# Patient Record
Sex: Female | Born: 1985 | Race: Black or African American | Hispanic: No | Marital: Single | Smoking: Never smoker
Health system: Southern US, Community
[De-identification: ages and names within clinical notes are randomized; demographics above are authoritative.]

## PROBLEM LIST (undated history)

## (undated) ENCOUNTER — Ambulatory Visit: Disposition: A | Discharge status: Other Acute Inpatient Hospital | Payer: Self-pay

## (undated) DIAGNOSIS — D649 Anemia, unspecified: Secondary | ICD-10-CM

## (undated) DIAGNOSIS — F909 Attention-deficit hyperactivity disorder, unspecified type: Secondary | ICD-10-CM

## (undated) DIAGNOSIS — D573 Sickle-cell trait: Secondary | ICD-10-CM

## (undated) DIAGNOSIS — F419 Anxiety disorder, unspecified: Secondary | ICD-10-CM

## (undated) DIAGNOSIS — J45909 Unspecified asthma, uncomplicated: Secondary | ICD-10-CM

## (undated) DIAGNOSIS — T7840XA Allergy, unspecified, initial encounter: Secondary | ICD-10-CM

## (undated) HISTORY — DX: Allergy, unspecified, initial encounter: T78.40XA

## (undated) HISTORY — PX: WISDOM TOOTH EXTRACTION: SHX21

## (undated) HISTORY — DX: Sickle-cell trait: D57.3

## (undated) HISTORY — DX: Anemia, unspecified: D64.9

---
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
## 2008-08-29 ENCOUNTER — Ambulatory Visit: Payer: Self-pay | Admitting: Internal Medicine

## 2008-08-29 DIAGNOSIS — R519 Headache, unspecified: Secondary | ICD-10-CM | POA: Insufficient documentation

## 2008-08-29 DIAGNOSIS — R51 Headache: Secondary | ICD-10-CM

## 2008-08-29 DIAGNOSIS — J45909 Unspecified asthma, uncomplicated: Secondary | ICD-10-CM

## 2008-08-29 LAB — CONVERTED CEMR LAB

## 2008-09-08 DIAGNOSIS — J309 Allergic rhinitis, unspecified: Secondary | ICD-10-CM | POA: Insufficient documentation

## 2008-09-08 DIAGNOSIS — D649 Anemia, unspecified: Secondary | ICD-10-CM

## 2008-09-12 ENCOUNTER — Ambulatory Visit: Payer: Self-pay | Admitting: Internal Medicine

## 2008-09-12 ENCOUNTER — Encounter: Payer: Self-pay | Admitting: Internal Medicine

## 2008-12-18 ENCOUNTER — Telehealth: Payer: Self-pay | Admitting: Internal Medicine

## 2008-12-18 ENCOUNTER — Emergency Department (HOSPITAL_COMMUNITY): Admission: EM | Admit: 2008-12-18 | Discharge: 2008-12-18 | Payer: Self-pay | Admitting: Emergency Medicine

## 2009-03-07 ENCOUNTER — Ambulatory Visit: Payer: Self-pay | Admitting: Internal Medicine

## 2009-03-07 DIAGNOSIS — M79609 Pain in unspecified limb: Secondary | ICD-10-CM

## 2009-10-15 ENCOUNTER — Ambulatory Visit: Payer: Self-pay | Admitting: Family

## 2010-06-30 NOTE — Assessment & Plan Note (Signed)
Summary: congestion & cough/dt   Vital Signs:  Patient profile:   25 year old female Height:      63.5 inches Weight:      132.25 pounds BMI:     23.14 O2 Sat:      99 % on Room air Temp:     98.6 degrees F oral Pulse rate:   86 / minute Pulse rhythm:   regular Resp:     12 per minute BP sitting:   90 / 62  (right arm) Cuff size:   regular  Vitals Entered By: Mervin Kung CMA (Oct 15, 2009 4:00 PM)  O2 Flow:  Room air CC: room 4  Pt states she has had a cough since Friday. Has run low grade fever. Pt needs refill on Qvar. Is Patient Diabetic? No   Primary Care Provider:  Dondra Spry DO  CC:  room 4  Pt states she has had a cough since Friday. Has run low grade fever. Pt needs refill on Qvar.Marland Kitchen  History of Present Illness: Ms Julie Lozano is a 25 year old female who presents today with complaint of cough x 5 days.  Cough is productive in AM.  Today she had green sputum.  Notes clear nasal congestion.  Saw Md at school clinic and was given rx for Zyrtec and Nasonex.  She stopped the Zyrtec due to dizziness.  Notes that she has had exacerbation of her ashtma symptoms along with her cough.  Notes + post nasal drip.  Did have slight fever of 99.1 yesterday.  Tell me that she never started Qvar and has run out of her albuterol inhaler.    Allergies (verified): No Known Drug Allergies  Physical Exam  General:  Well-developed,well-nourished,in no acute distress; alert,appropriate and cooperative throughout examination Eyes:  PERRLA Ears:  External ear exam shows no significant lesions or deformities.  Otoscopic examination reveals clear canals, tympanic membranes are intact bilaterally without bulging, retraction, inflammation or discharge. Hearing is grossly normal bilaterally. Neck:  mild cervical lymphadenopathy/tenderness noted Lungs:  Normal respiratory effort, chest expands symmetrically. Lungs are clear to auscultation, no crackles or wheezes. + hacking cough noted in exam  room Heart:  Normal rate and regular rhythm. S1 and S2 normal without gallop, murmur, click, rub or other extra sounds.   Impression & Recommendations:  Problem # 1:  ALLERGIC RHINITIS (ICD-477.9) Assessment Deteriorated Will try claritin to see if she tolerates this.  Recommended that she start nasonex as well. If no improvment with these measures, consider referral to an allergist. The following medications were removed from the medication list:    Cetirizine Hcl 10 Mg Tabs (Cetirizine hcl) .Marland Kitchen... Take 1 tablet by mouth once a day Her updated medication list for this problem includes:    Nasonex 50 Mcg/act Susp (Mometasone furoate) .Marland Kitchen... 2 sprays each nostril qd    Claritin 10 Mg Tabs (Loratadine) ..... One tablet by mouth daily  Problem # 2:  ASTHMA (ICD-493.90) Assessment: Deteriorated No wheezing today on exam, but suspect that cough is reactive in nature.  Recommended that she initiate Qvar and use albuterol regulary for the next few days.  Will add Zithromax to treat empirically for bronchitis given low grade fever and green sputum.  Patient advised to use condoms as back up protection due to abx- she tells me that she is not currently sexually active. The following medications were removed from the medication list:    Qvar 40 Mcg/act Aers (Beclomethasone dipropionate) .Marland Kitchen... 2 puffs bid Her  updated medication list for this problem includes:    Ventolin Hfa 108 (90 Base) Mcg/act Aers (Albuterol sulfate) .Marland Kitchen... 2 puffs by mouth as needed    Qvar 40 Mcg/act Aers (Beclomethasone dipropionate) .Marland Kitchen... 2 puffs twice daily  Complete Medication List: 1)  Multivitamins Tabs (Multiple vitamin) .... Take 1 tablet by mouth once a day 2)  Iromin-g Tabs (Iron-vitamins) .... Take 1 tablet by mouth once a day 3)  Ventolin Hfa 108 (90 Base) Mcg/act Aers (Albuterol sulfate) .... 2 puffs by mouth as needed 4)  Nasonex 50 Mcg/act Susp (Mometasone furoate) .... 2 sprays each nostril qd 5)  Ortho  Tri-cyclen (28) 0.18/0.215/0.25 Mg-35 Mcg Tabs (Norgestim-eth estrad triphasic) .... Once daily 6)  Qvar 40 Mcg/act Aers (Beclomethasone dipropionate) .... 2 puffs twice daily 7)  Zithromax Z-pak 250 Mg Tabs (Azithromycin) .... 2 tabs by mouth today, then one tablet by mouth daily x 4 more days 8)  Claritin 10 Mg Tabs (Loratadine) .... One tablet by mouth daily  Patient Instructions: 1)  Please call if symptoms worsen, or do not improve. Prescriptions: VENTOLIN HFA 108 (90 BASE) MCG/ACT AERS (ALBUTEROL SULFATE) 2 puffs by mouth as needed  #1 x 3   Entered and Authorized by:   Lemont Fillers FNP   Signed by:   Lemont Fillers FNP on 10/15/2009   Method used:   Electronically to        [facility] [address] (retail)       [address], [address]       [place], [place]  [address]       Ph: [phone]       Fax: [phone]   RxID:   [id number] ZITHROMAX Z-PAK 250 MG TABS (AZITHROMYCIN) 2 tabs by mouth today, then one tablet by mouth daily x 4 more days  #1 x 0   Entered and Authorized by:   Lemont Fillers FNP   Signed by:   Lemont Fillers FNP on 10/15/2009   Method used:   Electronically to        [facility] [address] (retail)       [address], [address]       [place], [place]  [address]       Ph: [phone]       Fax: [phone]   RxID:   [id number] QVAR 40 MCG/ACT AERS (BECLOMETHASONE DIPROPIONATE) 2 puffs twice daily  #1 x 1   Entered and Authorized by:   Lemont Fillers FNP   Signed by:   Lemont Fillers FNP on 10/15/2009   Method used:   Electronically to        [facility] [address] (retail)       [address], [address]       [place], [place]  [address]       Ph: [phone]       Fax: [phone]   RxID:   [id number]   Current Allergies (reviewed today): No known allergies

## 2011-07-23 ENCOUNTER — Other Ambulatory Visit: Payer: Self-pay

## 2011-07-28 ENCOUNTER — Encounter: Payer: Self-pay | Admitting: Internal Medicine

## 2012-08-25 ENCOUNTER — Encounter (HOSPITAL_COMMUNITY): Payer: Self-pay | Admitting: Emergency Medicine

## 2012-08-25 ENCOUNTER — Emergency Department (HOSPITAL_COMMUNITY)
Admission: EM | Admit: 2012-08-25 | Discharge: 2012-08-25 | Disposition: A | Discharge status: Home / Self Care | Payer: BC Managed Care – PPO | Attending: Emergency Medicine | Admitting: Emergency Medicine

## 2012-08-25 ENCOUNTER — Emergency Department (HOSPITAL_COMMUNITY): Payer: BC Managed Care – PPO

## 2012-08-25 DIAGNOSIS — R42 Dizziness and giddiness: Secondary | ICD-10-CM | POA: Insufficient documentation

## 2012-08-25 DIAGNOSIS — M62838 Other muscle spasm: Secondary | ICD-10-CM | POA: Insufficient documentation

## 2012-08-25 DIAGNOSIS — Z79899 Other long term (current) drug therapy: Secondary | ICD-10-CM | POA: Insufficient documentation

## 2012-08-25 DIAGNOSIS — J45909 Unspecified asthma, uncomplicated: Secondary | ICD-10-CM | POA: Insufficient documentation

## 2012-08-25 HISTORY — DX: Unspecified asthma, uncomplicated: J45.909

## 2012-08-25 MED ORDER — IBUPROFEN 800 MG PO TABS
800.0000 mg | ORAL_TABLET | Freq: Once | ORAL | Status: AC
  Administered 2012-08-25: 800 mg via ORAL
  Filled 2012-08-25: qty 1

## 2012-08-25 MED ORDER — METHOCARBAMOL 500 MG PO TABS
500.0000 mg | ORAL_TABLET | Freq: Once | ORAL | Status: AC
  Administered 2012-08-25: 500 mg via ORAL
  Filled 2012-08-25: qty 1

## 2012-08-25 MED ORDER — IBUPROFEN 400 MG PO TABS: 400.0000 mg | ORAL_TABLET | Freq: Four times a day (QID) | ORAL | Status: DC | PRN

## 2012-08-25 MED ORDER — METHOCARBAMOL 500 MG PO TABS: 500.0000 mg | ORAL_TABLET | Freq: Two times a day (BID) | ORAL | Status: DC

## 2012-08-25 MED ORDER — HYDROCODONE-ACETAMINOPHEN 5-325 MG PO TABS
2.0000 | ORAL_TABLET | Freq: Four times a day (QID) | ORAL | Status: DC | PRN
Start: 2012-08-25 — End: 2013-03-05

## 2012-08-25 NOTE — ED Notes (Signed)
Returned from xray

## 2012-08-25 NOTE — ED Notes (Signed)
States that she woke up with intense neck pain that radiates up into the lower aspect of her head down into her back. Track follows the trapezius muscle.

## 2012-08-25 NOTE — ED Notes (Signed)
[id number]<EX> Expected date:<BR> Expected time:<BR> Means of arrival:<BR> Comments:<BR> Muscle pain/ ems

## 2012-08-25 NOTE — ED Provider Notes (Signed)
History     CSN: [id number]  Arrival date & time 08/25/12  4098   First MD Initiated Contact with Patient 08/25/12 1001      Chief Complaint  Patient presents with  . Muscle Pain    (Consider location/radiation/quality/duration/timing/severity/associated sxs/prior treatment) HPI Comments: This is a 27 year old female who comes in today complaining of sudden onset sharp shooting neck pain. The pain starts on the left side of her occiput and travels down her back and shoulder when she moves. She states she has no pain when she cocks her head to the side, but any movement outside of that position causes excruciating 10/10 pain. It started this morning. She was laying in bed and was able to hit the snooze button on her alarm clock with no pain. Her alarm went off again and this time when she went to turn it off she felt the pain and heard a pop. Denies any numbness, tingling, weakness, abdominal pain, nausea, vomiting, or pain like this before.   The history is provided by the patient. No language interpreter was used.    Past Medical History  Diagnosis Date  . Asthma     History reviewed. No pertinent past surgical history.  No family history on file.  History  Substance Use Topics  . Smoking status: Not on file  . Smokeless tobacco: Not on file  . Alcohol Use: Not on file    OB History   Grav Para Term Preterm Abortions TAB SAB Ect Mult Living                  Review of Systems  Constitutional: Negative for fever, chills and unexpected weight change.  Eyes: Negative for photophobia and visual disturbance.  Respiratory: Negative for cough and shortness of breath.   Cardiovascular: Negative for chest pain.  Neurological: Positive for light-headedness (when she gets the sharp shooting pain). Negative for syncope, facial asymmetry, speech difficulty, weakness, numbness and headaches.  All other systems reviewed and are negative.    Allergies  Red dye  Home Medications    Current Outpatient Rx  Name  Route  Sig  Dispense  Refill  . albuterol (PROVENTIL HFA;VENTOLIN HFA) 108 (90 BASE) MCG/ACT inhaler   Inhalation   Inhale 2 puffs into the lungs every 4 (four) hours as needed for wheezing.         Marland Kitchen ibuprofen (ADVIL,MOTRIN) 200 MG tablet   Oral   Take 200 mg by mouth every 6 (six) hours as needed for pain.         . Multiple Vitamin (MULTIVITAMIN WITH MINERALS) TABS   Oral   Take 1 tablet by mouth daily.           BP 91/46  Pulse 60  Temp(Src) 98.7 F (37.1 C) (Oral)  Resp 21  SpO2 100%  LMP 08/23/2012  Physical Exam  Nursing note and vitals reviewed. Constitutional: She is oriented to person, place, and time. She appears well-developed and well-nourished. She does not appear ill. No distress.  HENT:  Head: Normocephalic and atraumatic.  Right Ear: External ear normal.  Left Ear: External ear normal.  Nose: Nose normal.  Mouth/Throat: Oropharynx is clear and moist.  Eyes: Conjunctivae, EOM and lids are normal. Pupils are equal, round, and reactive to light.  Neck: Trachea normal and phonation normal. Spinous process tenderness and muscular tenderness (tenderness over left paraspinal and trapezius) present. Decreased range of motion (head cocked to right - refuses to move due  to pain) present.  Cardiovascular: Normal rate, regular rhythm and normal heart sounds.   Pulmonary/Chest: Effort normal and breath sounds normal. No stridor. No respiratory distress. She has no wheezes. She has no rales.  Abdominal: Soft. Normal appearance and bowel sounds are normal. She exhibits no distension. There is no tenderness.  Musculoskeletal: She exhibits tenderness.  Lymphadenopathy:    She has no cervical adenopathy.  Neurological: She is alert and oriented to person, place, and time. She has normal strength. No sensory deficit. Coordination normal.  Skin: Skin is warm and dry. She is not diaphoretic. No erythema.  Psychiatric: She has a normal mood  and affect. Her behavior is normal.    ED Course  Procedures (including critical care time)  Labs Reviewed - No data to display Dg Cervical Spine Complete  08/25/2012  *RADIOLOGY REPORT*  Clinical Data: Severe lower cervical pain  CERVICAL SPINE - COMPLETE 4+ VIEW  Comparison: None.  Findings: Reversal of normal cervical lordosis.  The vertebral body heights and disc spaces are well preserved.   The neural foramina are patent bilaterally.  No fracture or subluxation identified. Suboptimal views of the odontoid process.  IMPRESSION:  1.  Reversal of the normal cervical lordosis which may reflect muscle spasm or patient positioning. 2.  No fracture or subluxation identified. 3.  Suboptimal imaging of the odontoid.   Original Report Authenticated By: Signa Kell, M.D.      1. Muscle spasms of neck       MDM  Patient presents today with sudden onset of neck pain with no injury. XR of cervical spine shows no fx or subluxation. No sensory deficit, neuro exam WNL. Suspect muscle spasm of neck due to awkward sleeping position. Given a muscle relaxer for spasm and Norco to take QHS. Showed patient stretching exercises. Resource guide given. Patient states she will establish care with PCP for follow up. Return precautions given including new onset numbness or weakness, fever, inability to eat or drink, worsening pain, abdominal pain, vomiting. Patient / Family / Caregiver informed of clinical course, understand medical decision-making process, and agree with plan.         Mora Bellman, PA-C 08/26/12 1241

## 2012-08-25 NOTE — ED Notes (Signed)
Patient transported to X-ray 

## 2012-08-25 NOTE — ED Notes (Signed)
Awaken this am and has neck/trapezia muscle pain since this am. States that she heard a pop this am when nursing her head. Denies any injury.

## 2012-08-26 NOTE — ED Provider Notes (Signed)
Medical screening examination/treatment/procedure(s) were performed by non-physician practitioner and as supervising physician I was immediately available for consultation/collaboration.  Flint Melter, MD 08/26/12 1623

## 2012-08-30 ENCOUNTER — Ambulatory Visit (INDEPENDENT_AMBULATORY_CARE_PROVIDER_SITE_OTHER): Payer: BC Managed Care – PPO | Admitting: Internal Medicine

## 2012-08-30 ENCOUNTER — Encounter: Payer: Self-pay | Admitting: Internal Medicine

## 2012-08-30 VITALS — BP 102/74 | HR 72 | Temp 98.3°F | Wt 135.0 lb

## 2012-08-30 DIAGNOSIS — L309 Dermatitis, unspecified: Secondary | ICD-10-CM | POA: Insufficient documentation

## 2012-08-30 DIAGNOSIS — M542 Cervicalgia: Secondary | ICD-10-CM

## 2012-08-30 DIAGNOSIS — L259 Unspecified contact dermatitis, unspecified cause: Secondary | ICD-10-CM

## 2012-08-30 MED ORDER — TRIAMCINOLONE ACETONIDE 0.1 % EX CREA: TOPICAL_CREAM | Freq: Two times a day (BID) | CUTANEOUS | Status: DC

## 2012-08-30 NOTE — Assessment & Plan Note (Signed)
27 year old [place] American female with left-sided neck pain. I agree she has neck spasm / somatic dysfunction.  Utilized myofacial release techniques and HVLA to lumbar and thoracic spine.  Patient tolerated well.  Continue use of muscle relaxants.  Reassess in 2 weeks.

## 2012-08-30 NOTE — Progress Notes (Signed)
Subjective:    Patient ID: Julie Lozano, female    DOB: [birth year], 27 y.o.   MRN: [id number]  HPI  27 year old [place] American female for emergency room followup. Patient seen on 08/25/2012 for  severe neck pain. Patient reports waking up in the morning and turning her head.  She hearing loud popping noise. Subsequently she developed severe left-sided neck pain and low back pain. Emergency room evaluation included x-ray of cervical spine. It was negative for any acute process. Patient noted to have reversal of normal cervical overdoses which may reflect muscle spasm or patient positioning.  She was prescribed Robaxin and hydrocodone. Patient reports some improvement in her neck symptoms. She still has limited range of motion and discomfort with certain positions. She denies any upper or lower extremity weakness.  Patient also complains of eczema on arms and legs. No improvement with otc hydrocortisone use.  Review of Systems Negative for weakness, no radiation of pain into arms or legs  Past Medical History  Diagnosis Date  . Asthma   . Anemia   . Allergy     History   Social History  . Marital Status: Married    Spouse Name: N/A    Number of Children: N/A  . Years of Education: N/A   Occupational History  . Not on file.   Social History Main Topics  . Smoking status: Never Smoker   . Smokeless tobacco: Not on file  . Alcohol Use: No  . Drug Use: No  . Sexually Active: Not on file   Other Topics Concern  . Not on file   Social History Narrative  . No narrative on file    History reviewed. No pertinent past surgical history.  Family History  Problem Relation Age of Onset  . Alcohol abuse Father   . Heart attack Father   . Hypertension    . Hyperlipidemia    . Diabetes Maternal Uncle     Allergies  Allergen Reactions  . Red Dye Diarrhea, Itching and Nausea Only    Current Outpatient Prescriptions on File Prior to Visit  Medication Sig Dispense Refill   . albuterol (PROVENTIL HFA;VENTOLIN HFA) 108 (90 BASE) MCG/ACT inhaler Inhale 2 puffs into the lungs every 4 (four) hours as needed for wheezing.      Marland Kitchen HYDROcodone-acetaminophen (NORCO/VICODIN) 5-325 MG per tablet Take 2 tablets by mouth every 6 (six) hours as needed for pain.  10 tablet  0  . ibuprofen (ADVIL,MOTRIN) 200 MG tablet Take 200 mg by mouth every 6 (six) hours as needed for pain.      Marland Kitchen ibuprofen (ADVIL,MOTRIN) 400 MG tablet Take 1 tablet (400 mg total) by mouth every 6 (six) hours as needed for pain.  30 tablet  0  . methocarbamol (ROBAXIN) 500 MG tablet Take 1 tablet (500 mg total) by mouth 2 (two) times daily.  20 tablet  0  . Multiple Vitamin (MULTIVITAMIN WITH MINERALS) TABS Take 1 tablet by mouth daily.       No current facility-administered medications on file prior to visit.    BP 102/74  Pulse 72  Temp(Src) 98.3 F (36.8 C) (Oral)  Wt 135 lb (61.236 kg)  BMI 23.54 kg/m2  LMP 08/23/2012       Objective:   Physical Exam  Constitutional: She is oriented to person, place, and time. She appears well-developed and well-nourished.  Cardiovascular: Normal rate, regular rhythm and normal heart sounds.   Pulmonary/Chest: Effort normal and breath sounds normal. She has no  wheezes.  Musculoskeletal:  Somatic dysfunction L4 rotated right, T7-9 rotated left  Neurological: She is alert and oriented to person, place, and time. She has normal reflexes. She displays normal reflexes. No cranial nerve deficit. She exhibits normal muscle tone.  Muscle strength is 5 out of 5 upper and lower extremities  Skin: Skin is warm and dry.  Scattered eczematous patches on forearms and legs  Psychiatric: She has a normal mood and affect. Her behavior is normal.          Assessment & Plan:

## 2012-08-30 NOTE — Patient Instructions (Addendum)
Perform stretching exercises as directed Please contact our office if your symptoms do not improve or gets worse.

## 2012-08-30 NOTE — Assessment & Plan Note (Signed)
Use triamcinolone cream as directed

## 2012-09-13 ENCOUNTER — Encounter: Payer: BC Managed Care – PPO | Admitting: Internal Medicine

## 2012-11-01 ENCOUNTER — Telehealth: Payer: Self-pay | Admitting: Internal Medicine

## 2012-11-01 NOTE — Telephone Encounter (Signed)
Please see my note.  I sent rx for triamcinolone crm 1% to her pharm.  If something went wrong with electronic rx, ok to resend medication or phone to pharm

## 2012-11-01 NOTE — Telephone Encounter (Signed)
Pt was seen on 08-30-12 and MD was suppose to call in med for eczema into [facility]

## 2012-11-02 MED ORDER — TRIAMCINOLONE ACETONIDE 0.1 % EX CREA: TOPICAL_CREAM | Freq: Two times a day (BID) | CUTANEOUS | Status: DC

## 2012-11-02 NOTE — Telephone Encounter (Signed)
rx sent in electronically, pt aware 

## 2013-03-05 ENCOUNTER — Ambulatory Visit (INDEPENDENT_AMBULATORY_CARE_PROVIDER_SITE_OTHER): Payer: BC Managed Care – PPO | Admitting: Family Medicine

## 2013-03-05 ENCOUNTER — Encounter: Payer: Self-pay | Admitting: Family Medicine

## 2013-03-05 VITALS — BP 110/62 | HR 95 | Temp 98.5°F | Wt 136.0 lb

## 2013-03-05 DIAGNOSIS — H6001 Abscess of right external ear: Secondary | ICD-10-CM

## 2013-03-05 DIAGNOSIS — H60399 Other infective otitis externa, unspecified ear: Secondary | ICD-10-CM

## 2013-03-05 MED ORDER — DOXYCYCLINE HYCLATE 100 MG PO CAPS: 100.0000 mg | ORAL_CAPSULE | Freq: Two times a day (BID) | ORAL | Status: DC

## 2013-03-05 NOTE — Progress Notes (Signed)
  Subjective:    Patient ID: Julie Lozano, female    DOB: 28-Aug-1985, 27 y.o.   MRN: [id number]  HPI Acute visit for right external ear infection Patient had right and left ears pierced involving the cartilage 3 weeks ago. One week ago she noticed some erythema, tenderness, redness, and swelling involving the right external ear. She noted drainage couple days ago. No fevers or chills. She has not had problems with piercings in the past. No history of MRSA.  Past Medical History  Diagnosis Date  . Asthma   . Anemia   . Allergy    No past surgical history on file.  reports that she has never smoked. She does not have any smokeless tobacco history on file. She reports that she does not drink alcohol or use illicit drugs. family history includes Alcohol abuse in her father; Diabetes in her maternal uncle; Heart attack in her father; Hyperlipidemia in an other family member; Hypertension in an other family member. Allergies  Allergen Reactions  . Red Dye Diarrhea, Itching and Nausea Only      Review of Systems  Constitutional: Negative for fever and chills.       Objective:   Physical Exam  Constitutional: She appears well-developed and well-nourished.  HENT:  Right external ear reveals mild erythema. She has some visible purulence under the skin with minimal pressure this is expressed. There no deeper pockets  Cardiovascular: Normal rate.   Pulmonary/Chest: Effort normal and breath sounds normal. No respiratory distress. She has no wheezes. She has no rales.          Assessment & Plan:  Abscess right external ear following piercings few weeks ago-draining spontaneously. Culture obtained. Doxycycline 100 mg 2 times a day for 10 days pending culture results. Warm compresses several times daily. Followup with primary in one week to reassess

## 2013-03-05 NOTE — Patient Instructions (Signed)
Warm compresses to right ear several times daily. Follow up promptly for any fever or increased swelling.

## 2013-03-08 LAB — WOUND CULTURE: Gram Stain: NONE SEEN

## 2013-03-13 ENCOUNTER — Ambulatory Visit: Payer: BC Managed Care – PPO | Admitting: Internal Medicine

## 2013-03-13 DIAGNOSIS — Z0289 Encounter for other administrative examinations: Secondary | ICD-10-CM

## 2013-04-19 ENCOUNTER — Telehealth: Payer: Self-pay | Admitting: Internal Medicine

## 2013-04-19 NOTE — Telephone Encounter (Signed)
Pt needs tb test asap. Would like to come in friday for that. Is it ok if she has it read on Monday?

## 2013-04-19 NOTE — Telephone Encounter (Signed)
Ok to schedule.

## 2013-04-19 NOTE — Telephone Encounter (Signed)
Pt following up on your phone call and the labs that were done in Oct. Pt states her symptoms are back full blown as before for her right ear. The piercing is infected . Would like a refill on med. Ok to leave message on [employer]

## 2013-04-19 NOTE — Telephone Encounter (Signed)
Informed patient that she would need to be seen again

## 2013-04-19 NOTE — Telephone Encounter (Signed)
lmom for pt that its ok to have tb test done on Friday. Lm to be her at 4:15pm

## 2013-04-20 ENCOUNTER — Ambulatory Visit: Payer: BC Managed Care – PPO | Admitting: *Deleted

## 2013-05-14 ENCOUNTER — Ambulatory Visit (INDEPENDENT_AMBULATORY_CARE_PROVIDER_SITE_OTHER): Payer: BC Managed Care – PPO | Admitting: Family Medicine

## 2013-05-14 ENCOUNTER — Encounter: Payer: Self-pay | Admitting: Family Medicine

## 2013-05-14 VITALS — BP 94/70 | Temp 97.6°F | Wt 138.0 lb

## 2013-05-14 DIAGNOSIS — H6690 Otitis media, unspecified, unspecified ear: Secondary | ICD-10-CM

## 2013-05-14 DIAGNOSIS — L309 Dermatitis, unspecified: Secondary | ICD-10-CM

## 2013-05-14 DIAGNOSIS — L089 Local infection of the skin and subcutaneous tissue, unspecified: Secondary | ICD-10-CM

## 2013-05-14 DIAGNOSIS — H6693 Otitis media, unspecified, bilateral: Secondary | ICD-10-CM

## 2013-05-14 DIAGNOSIS — L259 Unspecified contact dermatitis, unspecified cause: Secondary | ICD-10-CM

## 2013-05-14 MED ORDER — DOXYCYCLINE HYCLATE 100 MG PO TABS: 100.0000 mg | ORAL_TABLET | Freq: Two times a day (BID) | ORAL | Status: DC

## 2013-05-14 NOTE — Patient Instructions (Signed)
Take antibiotic as instructed  Remove earrings if continue to have issues  Cerave CREAM or Cetaphil Restoraderm for skin daily  Humidifier

## 2013-05-14 NOTE — Progress Notes (Signed)
Pre visit review using our clinic review tool, if applicable. No additional management support is needed unless otherwise documented below in the visit note. 

## 2013-05-14 NOTE — Progress Notes (Signed)
Chief Complaint  Patient presents with  . ear cartilage infection bilaterally    reddness, swelling, painful, colored pus since November    HPI:  Acute visit for ear pain: -started 3-4 weeks ago -swelling of ear where earrings are -had this before and treated with abx which resolved symptoms, cx at time with staph (not mrsa) -occ has pus from earring holes from time to time - a little better today -denies: swelling of rest of ear, face or fevers  Eczema: -chronic -out of steroid cream  ROS: See pertinent positives and negatives per HPI.  Past Medical History  Diagnosis Date  . Asthma   . Anemia   . Allergy     No past surgical history on file.  Family History  Problem Relation Age of Onset  . Alcohol abuse Father   . Heart attack Father   . Hypertension    . Hyperlipidemia    . Diabetes Maternal Uncle     History   Social History  . Marital Status: Married    Spouse Name: N/A    Number of Children: N/A  . Years of Education: N/A   Social History Main Topics  . Smoking status: Never Smoker   . Smokeless tobacco: None  . Alcohol Use: No  . Drug Use: No  . Sexual Activity: None   Other Topics Concern  . None   Social History Narrative  . None    Current outpatient prescriptions:albuterol (PROVENTIL HFA;VENTOLIN HFA) 108 (90 BASE) MCG/ACT inhaler, Inhale 2 puffs into the lungs every 4 (four) hours as needed for wheezing., Disp: , Rfl: ;  doxycycline (VIBRA-TABS) 100 MG tablet, Take 1 tablet (100 mg total) by mouth 2 (two) times daily., Disp: 20 tablet, Rfl: 0;  Multiple Vitamin (MULTIVITAMIN WITH MINERALS) TABS, Take 1 tablet by mouth daily., Disp: , Rfl:  triamcinolone cream (KENALOG) 0.1 %, Apply topically 2 (two) times daily., Disp: 30 g, Rfl: 2  EXAM:  Filed Vitals:   05/14/13 1541  BP: 94/70  Temp: 97.6 F (36.4 C)    Body mass index is 24.06 kg/(m^2).  GENERAL: vitals reviewed and listed above, alert, oriented, appears well hydrated and in  no acute distress  HEENT: atraumatic, conjunttiva clear, no obvious abnormalities on inspection of external nose and ears  NECK: no obvious masses on inspection  SKIN: mild erythema and crusting around earrings in helix, eczema hands and extremities  MS: moves all extremities without noticeable abnormality  PSYCH: pleasant and cooperative, no obvious depression or anxiety  ASSESSMENT AND PLAN:  Discussed the following assessment and plan:  Ear infection, bilateral - Plan: doxycycline (VIBRA-TABS) 100 MG tablet  Skin infection - Plan: doxycycline (VIBRA-TABS) 100 MG tablet  Eczema  -tx earring related infection with doxy and advised removal - may have meal sensitivity - has refills on steroid cream advised all hypoallergenic products, humidifier, emollients with ceramides -Patient advised to return or notify a doctor immediately if symptoms worsen or persist or new concerns arise.  Patient Instructions  Take antibiotic as instructed  Remove earrings if continue to have issues  Cerave CREAM or Cetaphil Restoraderm for skin daily  Humidifier        KIM, HANNAH R.

## 2013-07-23 ENCOUNTER — Encounter: Payer: BC Managed Care – PPO | Admitting: Family Medicine

## 2013-07-23 NOTE — Progress Notes (Signed)
Error   This encounter was created in error - please disregard. 

## 2013-08-22 ENCOUNTER — Ambulatory Visit: Payer: BC Managed Care – PPO | Admitting: Internal Medicine

## 2014-07-23 ENCOUNTER — Encounter: Payer: Self-pay | Admitting: Family Medicine

## 2014-07-23 ENCOUNTER — Ambulatory Visit (INDEPENDENT_AMBULATORY_CARE_PROVIDER_SITE_OTHER): Payer: No Typology Code available for payment source | Admitting: Family Medicine

## 2014-07-23 VITALS — HR 93 | Temp 97.9°F | Wt 137.0 lb

## 2014-07-23 DIAGNOSIS — R21 Rash and other nonspecific skin eruption: Secondary | ICD-10-CM

## 2014-07-23 MED ORDER — PREDNISONE 10 MG PO TABS: ORAL_TABLET | ORAL | Status: DC

## 2014-07-23 NOTE — Progress Notes (Signed)
Pre visit review using our clinic review tool, if applicable. No additional management support is needed unless otherwise documented below in the visit note. 

## 2014-07-23 NOTE — Progress Notes (Signed)
   Subjective:    Patient ID: Julie BerryJessica Lozano, female    DOB: 1986-04-30, 29 y.o.   MRN: [id number]  HPI Acute visit for rash forearms. She has long history of allergies and atopic eczema. She has seen dermatologist in past (for her atopic eczema). This current rash is different from her typical eczema rash. She has 3 day history of follicular type rash on both forearms. Very pruritic. Symptoms activities especially worse at night. She's not tried any antihistamines. She had some triamcinolone 0.1% cream which does not help. No fevers or chills. No recent change of soaps or detergents. She does not have any other areas of involvement. She works in [employer]. 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   Review of Systems  Constitutional: Negative for fever and chills.  Skin: Positive for rash.       Objective:   Physical Exam  Constitutional: She appears well-developed and well-nourished.  Cardiovascular: Normal rate and regular rhythm.   Pulmonary/Chest: Effort normal and breath sounds normal. No respiratory distress. She has no wheezes. She has no rales.  Skin: Rash noted.  Very small pinpoint follicular rash forearms. No pustules. No urticaria. No scaling.          Assessment & Plan:  Nonspecific follicular type rash forearms. Uncertain etiology. Try over-the-counter Claritin, Zyrtec, or Allegra for itching. Consider prednisone taper if not resolving with the above. Long sleeves when using cleaning solutions that are aerosolized to avoid skin contact.

## 2014-07-23 NOTE — Patient Instructions (Signed)
Try over the counter Allegra, Claritan, or Zyrtec Consider prednisone taper if not resolving with the above.

## 2014-09-14 ENCOUNTER — Emergency Department (HOSPITAL_COMMUNITY)
Admission: EM | Admit: 2014-09-14 | Discharge: 2014-09-14 | Disposition: A | Discharge status: Home / Self Care | Payer: No Typology Code available for payment source | Attending: Emergency Medicine | Admitting: Emergency Medicine

## 2014-09-14 ENCOUNTER — Encounter (HOSPITAL_COMMUNITY): Payer: Self-pay | Admitting: Emergency Medicine

## 2014-09-14 DIAGNOSIS — Z23 Encounter for immunization: Secondary | ICD-10-CM | POA: Diagnosis not present

## 2014-09-14 DIAGNOSIS — Y9389 Activity, other specified: Secondary | ICD-10-CM | POA: Diagnosis not present

## 2014-09-14 DIAGNOSIS — IMO0002 Reserved for concepts with insufficient information to code with codable children: Secondary | ICD-10-CM

## 2014-09-14 DIAGNOSIS — Y998 Other external cause status: Secondary | ICD-10-CM | POA: Diagnosis not present

## 2014-09-14 DIAGNOSIS — Z79899 Other long term (current) drug therapy: Secondary | ICD-10-CM | POA: Insufficient documentation

## 2014-09-14 DIAGNOSIS — Z862 Personal history of diseases of the blood and blood-forming organs and certain disorders involving the immune mechanism: Secondary | ICD-10-CM | POA: Diagnosis not present

## 2014-09-14 DIAGNOSIS — J45909 Unspecified asthma, uncomplicated: Secondary | ICD-10-CM | POA: Diagnosis not present

## 2014-09-14 DIAGNOSIS — Y9289 Other specified places as the place of occurrence of the external cause: Secondary | ICD-10-CM | POA: Diagnosis not present

## 2014-09-14 DIAGNOSIS — S61212A Laceration without foreign body of right middle finger without damage to nail, initial encounter: Secondary | ICD-10-CM | POA: Insufficient documentation

## 2014-09-14 DIAGNOSIS — Y288XXA Contact with other sharp object, undetermined intent, initial encounter: Secondary | ICD-10-CM | POA: Insufficient documentation

## 2014-09-14 MED ORDER — TETANUS-DIPHTH-ACELL PERTUSSIS 5-2.5-18.5 LF-MCG/0.5 IM SUSP
0.5000 mL | Freq: Once | INTRAMUSCULAR | Status: AC
  Administered 2014-09-14: 0.5 mL via INTRAMUSCULAR
  Filled 2014-09-14: qty 0.5

## 2014-09-14 MED ORDER — LIDOCAINE HCL (PF) 1 % IJ SOLN
5.0000 mL | Freq: Once | INTRAMUSCULAR | Status: AC
  Administered 2014-09-14: 5 mL via INTRADERMAL
  Filled 2014-09-14: qty 5

## 2014-09-14 NOTE — ED Notes (Signed)
Pt. Stated, I cut my right index finger while opening a can

## 2014-09-14 NOTE — ED Provider Notes (Signed)
CSN: [id number]     Arrival date & time 09/14/14  1131 History  This chart was scribed for Harle Battiest, NP, working with Samuel Jester, DO by Chestine Spore, ED Scribe. The patient was seen in room TR05C/TR05C at 12:17 PM.    Chief Complaint  Patient presents with  . Extremity Laceration      The history is provided by the patient. No language interpreter was used.    HPI Comments: Julie Lozano is a 29 y.o. female who presents to the Emergency Department complaining of extremity laceration onset today 1 hour ago PTA. Pt cut her finger while opening a can today. Pt notes that her pain is 3-4/10 but it is worsened with movement. Pt is not UTD on her tetanus. She denies joint swelling and any other symptoms.    Past Medical History  Diagnosis Date  . Asthma   . Anemia   . Allergy    History reviewed. No pertinent past surgical history. Family History  Problem Relation Age of Onset  . Alcohol abuse Father   . Heart attack Father   . Hypertension    . Hyperlipidemia    . Diabetes Maternal Uncle    History  Substance Use Topics  . Smoking status: Never Smoker   . Smokeless tobacco: Not on file  . Alcohol Use: No   OB History    No data available     Review of Systems  Skin: Positive for wound. Negative for color change.  Neurological: Negative for weakness.      Allergies  Red dye and Shellfish allergy  Home Medications   Prior to Admission medications   Medication Sig Start Date End Date Taking? Authorizing Provider  albuterol (PROVENTIL HFA;VENTOLIN HFA) 108 (90 BASE) MCG/ACT inhaler Inhale 2 puffs into the lungs every 4 (four) hours as needed for wheezing.   Yes Historical Provider, MD  Multiple Vitamin (MULTIVITAMIN WITH MINERALS) TABS Take 1 tablet by mouth daily.   Yes Historical Provider, MD  predniSONE (DELTASONE) 10 MG tablet Taper as follows: 4-4-4-3-3-2-2-1-1 Patient not taking: Reported on 09/14/2014 07/23/14   Kristian Covey, MD   BP  113/58 mmHg  Pulse 65  Temp(Src) 98 F (36.7 C) (Oral)  Resp 14  Ht  (1.6 m)  Wt 135 lb (61.236 kg)  BMI 23.92 kg/m2  SpO2 99%  LMP 09/07/2014  Physical Exam  Constitutional: She is oriented to person, place, and time. She appears well-developed and well-nourished. No distress.  HENT:  Head: Normocephalic and atraumatic.  Eyes: EOM are normal.  Neck: Neck supple. No tracheal deviation present.  Cardiovascular: Normal rate.   Pulmonary/Chest: Effort normal. No respiratory distress.  Musculoskeletal: Normal range of motion.  5/5 strength with flexion and extension of the affected finger. NVI.  Neurological: She is alert and oriented to person, place, and time.  Skin: Skin is warm and dry. Laceration noted.  1.5 cm lunar shaped laceration with the edges well approximate and bleeding well controlled. laceration to the lateral aspect of right middle finger.  Psychiatric: She has a normal mood and affect. Her behavior is normal.  Nursing note and vitals reviewed.   ED Course  Procedures (including critical care time) LACERATION REPAIR Performed by: Harle Battiest Authorized by: Harle Battiest Consent: Verbal consent obtained. Risks and benefits: risks, benefits and alternatives were discussed Consent given by: patient Patient identity confirmed: provided demographic data Prepped and Draped in normal sterile fashion Wound explored  Laceration Location: right middle finger  Laceration  Length: 1.5 cm  No Foreign Bodies seen or palpated  Anesthesia: local infiltration  Local anesthetic: lidocaine 1% without epinephrine  Anesthetic total: 3 ml  Irrigation method: syringe Amount of cleaning: standard  Skin closure: 5-0 prolene  Number of sutures: 2  Technique: simple interrupted  Patient tolerance: Patient tolerated the procedure well with no immediate complications.   DIAGNOSTIC STUDIES: Oxygen Saturation is 99% on RA, nl by my interpretation.     COORDINATION OF CARE: 12:20 PM-Discussed treatment plan which includes laceration repair and f/u in 7 days for suture removal with pt at bedside and pt agreed to plan.   Labs Review Labs Reviewed - No data to display  Imaging Review No results found.   EKG Interpretation None      MDM   Final diagnoses:  Laceration   29 yo nwith laceration occuring < 2 hours prior to repair. Tdap booster given. ressure irrigation performed. Pt has no co morbidities to effect normal wound healing. Discussed suture home care w pt and answered questions. Pt is well-appearing, in no acute distress and vital signs reviewed and not concerning. She appears safe to be discharged.  Discharge include follow-up  for wound check and suture removal in 7 days.   Return precautions provided. Pt aware of plan and in agreement.    I personally performed the services described in this documentation, which was scribed in my presence. The recorded information has been reviewed and is accurate.  Filed Vitals:   09/14/14 1146 09/14/14 1342  BP: 113/58 103/64  Pulse: 65 66  Temp: 98 F (36.7 C) 97.4 F (36.3 C)  TempSrc: Oral Oral  Resp: 14 16  Height: 5\' 3"  (1.6 m)   Weight: 135 lb (61.236 kg)   SpO2: 99% 100%   Meds given in ED:  Medications  lidocaine (PF) (XYLOCAINE) 1 % injection 5 mL (5 mLs Intradermal Given 09/14/14 1231)  Tdap (BOOSTRIX) injection 0.5 mL (0.5 mLs Intramuscular Given 09/14/14 1350)    Discharge Medication List as of 09/14/2014  1:43 PM        Harle BattiestElizabeth Alger Kerstein, NP 09/15/14 [id number]  Samuel JesterKathleen McManus, DO 09/15/14 1400

## 2014-09-14 NOTE — Discharge Instructions (Signed)
Please follow directions provided. Be sure to follow-up in 7 days in the emergency department or with your primary care doctor to have your stitches removed. Keep your wound clean and dry, change her dressing daily using antibiotic ointment. You may use Tylenol or ibuprofen for pain. Don't hesitate to return for any new, worsening, or concerning symptoms.   SEEK IMMEDIATE MEDICAL CARE IF:  Your pain is not controlled with prescribed medicine.  You have severe swelling around the wound causing pain and numbness or a change in color in your arm, hand, leg, or foot.  Your wound splits open and starts bleeding.  You have worsening numbness, weakness, or loss of function of any joint around or beyond the wound.  You develop painful lumps near the wound or on the skin anywhere on your body.

## 2014-09-17 ENCOUNTER — Emergency Department (HOSPITAL_COMMUNITY)
Admission: EM | Admit: 2014-09-17 | Discharge: 2014-09-17 | Disposition: A | Discharge status: Home / Self Care | Payer: No Typology Code available for payment source | Attending: Emergency Medicine | Admitting: Emergency Medicine

## 2014-09-17 ENCOUNTER — Encounter (HOSPITAL_COMMUNITY): Payer: Self-pay

## 2014-09-17 DIAGNOSIS — Z4801 Encounter for change or removal of surgical wound dressing: Secondary | ICD-10-CM | POA: Diagnosis present

## 2014-09-17 DIAGNOSIS — Z862 Personal history of diseases of the blood and blood-forming organs and certain disorders involving the immune mechanism: Secondary | ICD-10-CM | POA: Insufficient documentation

## 2014-09-17 DIAGNOSIS — Z5189 Encounter for other specified aftercare: Secondary | ICD-10-CM

## 2014-09-17 DIAGNOSIS — Z79899 Other long term (current) drug therapy: Secondary | ICD-10-CM | POA: Diagnosis not present

## 2014-09-17 DIAGNOSIS — J45909 Unspecified asthma, uncomplicated: Secondary | ICD-10-CM | POA: Diagnosis not present

## 2014-09-17 NOTE — Discharge Instructions (Signed)
Fingertip Injuries and Amputations °Fingertip injuries are common and often get injured because they are last to escape when pulling your hand out of harm's way. You have amputated (cut off) part of your finger. How this turns out depends largely on how much was amputated. If just the tip is amputated, often the end of the finger will grow back and the finger may return to much the same as it was before the injury.  °If more of the finger is missing, your caregiver has done the best with the tissue remaining to allow you to keep as much finger as is possible. Your caregiver after checking your injury has tried to leave you with a painless fingertip that has durable, feeling skin. If possible, your caregiver has tried to maintain the finger's length and appearance and preserve its fingernail.  °Please read the instructions outlined below and refer to this sheet in the next few weeks. These instructions provide you with general information on caring for yourself. Your caregiver may also give you specific instructions. While your treatment has been done according to the most current medical practices available, unavoidable complications occasionally occur. If you have any problems or questions after discharge, please call your caregiver. °HOME CARE INSTRUCTIONS  °· You may resume normal diet and activities as directed or allowed. °· Keep your hand elevated above the level of your heart. This helps decrease pain and swelling. °· Keep ice packs (or a bag of ice wrapped in a towel) on the injured area for 15-20 minutes, 03-04 times per day, for the first two days. °· Change dressings if necessary or as directed. °· Clean the wound daily or as directed. °· Only take over-the-counter or prescription medicines for pain, discomfort, or fever as directed by your caregiver. °· Keep appointments as directed. °SEEK IMMEDIATE MEDICAL CARE IF: °· You develop redness, swelling, numbness or increasing pain in the wound. °· There is  pus coming from the wound. °· You develop an unexplained oral temperature above 102° F (38.9° C) or as your caregiver suggests. °· There is a foul (bad) smell coming from the wound or dressing. °· There is a breaking open of the wound (edges not staying together) after sutures or staples have been removed. °MAKE SURE YOU:  °· Understand these instructions. °· Will watch your condition. °· Will get help right away if you are not doing well or get worse. °Document Released: 04/07/2005 Document Revised: 08/09/2011 Document Reviewed: 03/06/2008 °[employer]® Patient Information ©2015 [employer]. This information is not intended to replace advice given to you by your health care provider. Make sure you discuss any questions you have with your health care provider. ° °

## 2014-09-17 NOTE — ED Provider Notes (Signed)
CSN: [id number]     Arrival date & time 09/17/14  1910 History  This chart was scribed for non-physician practitioner Roxy Horseman, PA-C working with Arby Barrette, MD by Littie Deeds, ED Scribe. This patient was seen in room TR05C/TR05C and the patient's care was started at 7:33 PM.       Chief Complaint  Patient presents with  . Suture / Staple Removal   The history is provided by the patient. No language interpreter was used.   HPI Comments: Julie Lozano is a 29 y.o. female who presents to the Emergency Department for wound check. Patient states that she had stitches placed in her finger a few days ago. She states that it feels like one of the stitches burst. She denies any additional significant bleeding, discharge, or drainage. Denies any fevers, chills. States that it is still painful to palpation. Denies difficulty with range of motion.   Past Medical History  Diagnosis Date  . Asthma   . Anemia   . Allergy    History reviewed. No pertinent past surgical history. Family History  Problem Relation Age of Onset  . Alcohol abuse Father   . Heart attack Father   . Hypertension    . Hyperlipidemia    . Diabetes Maternal Uncle    History  Substance Use Topics  . Smoking status: Never Smoker   . Smokeless tobacco: Not on file  . Alcohol Use: No   OB History    No data available     Review of Systems  Constitutional: Negative for fever and chills.  Respiratory: Negative for shortness of breath.   Cardiovascular: Negative for chest pain.  Gastrointestinal: Negative for nausea, vomiting, diarrhea and constipation.  Genitourinary: Negative for dysuria.  Skin: Positive for wound.      Allergies  Red dye and Shellfish allergy  Home Medications   Prior to Admission medications   Medication Sig Start Date End Date Taking? Authorizing Provider  albuterol (PROVENTIL HFA;VENTOLIN HFA) 108 (90 BASE) MCG/ACT inhaler Inhale 2 puffs into the lungs every 4 (four) hours as  needed for wheezing.    Historical Provider, MD  Multiple Vitamin (MULTIVITAMIN WITH MINERALS) TABS Take 1 tablet by mouth daily.    Historical Provider, MD  predniSONE (DELTASONE) 10 MG tablet Taper as follows: 4-4-4-3-3-2-2-1-1 Patient not taking: Reported on 09/14/2014 07/23/14   Kristian Covey, MD   BP 113/60 mmHg  Pulse 73  Temp(Src) 98 F (36.7 C) (Oral)  Resp 18  Ht  (1.6 m)  Wt 138 lb 14.4 oz (63.005 kg)  BMI 24.61 kg/m2  SpO2 98%  LMP 09/07/2014 Physical Exam  Constitutional: She is oriented to person, place, and time. She appears well-developed and well-nourished. No distress.  HENT:  Head: Normocephalic and atraumatic.  Mouth/Throat: Oropharynx is clear and moist. No oropharyngeal exudate.  Eyes: Pupils are equal, round, and reactive to light.  Neck: Neck supple.  Cardiovascular: Normal rate and intact distal pulses.   Pulmonary/Chest: Effort normal.  Musculoskeletal: She exhibits no edema.  Neurological: She is alert and oriented to person, place, and time. No cranial nerve deficit.  Skin: Skin is warm and dry. No rash noted.  Well-healing laceration to right distal middle finger, no discharge, no drainage, no evidence of infection  Psychiatric: She has a normal mood and affect. Her behavior is normal.  Nursing note and vitals reviewed.   ED Course  Procedures  DIAGNOSTIC STUDIES: Oxygen Saturation is 98% on room air, normal by my interpretation.  COORDINATION OF CARE:   Labs Review Labs Reviewed - No data to display  Imaging Review No results found.   EKG Interpretation None      MDM   Final diagnoses:  Visit for wound check    Sutures remain intact, and there is no evidence of infection. Will give the patient a finger splint for protection. Discharge to home with return precautions. Sutures out in 8 days.  I personally performed the services described in this documentation, which was scribed in my presence. The recorded information has  been reviewed and is accurate.     Roxy HorsemanRobert Sierra Bissonette, PA-C 09/17/14 2008  Arby BarretteMarcy Pfeiffer, MD 09/27/14 [phone]

## 2014-09-17 NOTE — ED Notes (Signed)
Pt had stretch bandage stuck to sutures.  Pt wants splint for finger to protect finger at work.

## 2014-09-17 NOTE — ED Notes (Signed)
Pt had sutures placed Saturday, sts she thinks they came loose and having pain, requesting splint due to job role also

## 2014-10-04 ENCOUNTER — Encounter: Payer: Self-pay | Admitting: Family Medicine

## 2014-10-04 ENCOUNTER — Encounter: Payer: Self-pay | Admitting: *Deleted

## 2014-10-04 ENCOUNTER — Ambulatory Visit (INDEPENDENT_AMBULATORY_CARE_PROVIDER_SITE_OTHER): Payer: No Typology Code available for payment source | Admitting: Family Medicine

## 2014-10-04 VITALS — BP 100/70 | HR 86 | Temp 97.6°F | Ht 63.0 in | Wt 135.8 lb

## 2014-10-04 DIAGNOSIS — M791 Myalgia, unspecified site: Secondary | ICD-10-CM

## 2014-10-04 DIAGNOSIS — T148 Other injury of unspecified body region: Secondary | ICD-10-CM

## 2014-10-04 DIAGNOSIS — IMO0002 Reserved for concepts with insufficient information to code with codable children: Secondary | ICD-10-CM

## 2014-10-04 MED ORDER — TIZANIDINE HCL 2 MG PO TABS: 2.0000 mg | ORAL_TABLET | Freq: Every evening | ORAL | Status: DC | PRN

## 2014-10-04 NOTE — Patient Instructions (Signed)
BEFORE YOU LEAVE: -upper back exercises -schedule follow up in 3-4 weeks - or sooner if worsening or new symptoms  Do the exercises daily  Aleve 1-2 tablets not more then twice daily or tylenol 500-1000mg  up to 3 times daily AS NEEDED for pain  Zanaflex (tizanidine) 2mg  once nightly to help relax muscles
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
## 2014-10-04 NOTE — Progress Notes (Signed)
Pre visit review using our clinic review tool, if applicable. No additional management support is needed unless otherwise documented below in the visit note. 

## 2014-11-01 ENCOUNTER — Ambulatory Visit (INDEPENDENT_AMBULATORY_CARE_PROVIDER_SITE_OTHER): Payer: Self-pay | Admitting: Family Medicine

## 2014-11-01 DIAGNOSIS — R69 Illness, unspecified: Secondary | ICD-10-CM

## 2014-11-01 NOTE — Progress Notes (Signed)
NO SHOW

## 2014-12-21 ENCOUNTER — Ambulatory Visit (INDEPENDENT_AMBULATORY_CARE_PROVIDER_SITE_OTHER): Payer: No Typology Code available for payment source | Admitting: Family Medicine

## 2014-12-21 ENCOUNTER — Encounter: Payer: Self-pay | Admitting: Family Medicine

## 2014-12-21 VITALS — BP 118/76 | Temp 98.4°F | Wt 129.0 lb

## 2014-12-21 DIAGNOSIS — M79672 Pain in left foot: Secondary | ICD-10-CM | POA: Insufficient documentation

## 2014-12-21 NOTE — Assessment & Plan Note (Signed)
Post op shoe given to patient See PcP if symptoms do not improve.

## 2014-12-21 NOTE — Progress Notes (Signed)
   Subjective:    Patient ID: Julie Lozano, female    DOB: [birth year], 29 y.o.   MRN: [id number]  HPI Pt here c/o R foot pain.  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  Her post op shoe is old and finally broke.     Review of Systems  Constitutional: Negative for diaphoresis, appetite change, fatigue and unexpected weight change.  Eyes: Negative for pain, redness and visual disturbance.  Respiratory: Negative for cough, chest tightness, shortness of breath and wheezing.   Cardiovascular: Negative for chest pain, palpitations and leg swelling.  Endocrine: Negative for cold intolerance, heat intolerance, polydipsia, polyphagia and polyuria.  Genitourinary: Negative for dysuria, frequency and difficulty urinating.  Musculoskeletal: Positive for joint swelling and gait problem.       Pt c/o pain in ball L foot--- was diagnosed with "dancers foot" many years ago  Neurological: Negative for dizziness, light-headedness, numbness and headaches.   Past Medical History  Diagnosis Date  . Asthma   . Anemia   . Allergy    Current Outpatient Prescriptions on File Prior to Visit  Medication Sig Dispense Refill  . Multiple Vitamin (MULTIVITAMIN WITH MINERALS) TABS Take 1 tablet by mouth daily.    Marland Kitchen tiZANidine (ZANAFLEX) 2 MG tablet Take 1 tablet (2 mg total) by mouth at bedtime as needed for muscle spasms. 20 tablet 0  . albuterol (PROVENTIL HFA;VENTOLIN HFA) 108 (90 BASE) MCG/ACT inhaler Inhale 2 puffs into the lungs every 4 (four) hours as needed for wheezing.     No current facility-administered medications on file prior to visit.   Allergies  Allergen Reactions  . Red Dye Diarrhea, Itching and Nausea Only  . Shellfish Allergy    History   Social History  . Marital Status: Married    Spouse Name: N/A  . Number of Children: N/A  . Years of Education: N/A   Occupational History  . Not on file.   Social  History Main Topics  . Smoking status: Never Smoker   . Smokeless tobacco: Not on file  . Alcohol Use: No  . Drug Use: No  . Sexual Activity: Not on file   Other Topics Concern  . Not on file   Social History Narrative       Objective:   Physical Exam  Musculoskeletal:       Left foot: There is tenderness, bony tenderness and swelling. There is normal range of motion, normal capillary refill, no crepitus, no deformity and no laceration.       Feet:          Assessment & Plan:

## 2014-12-28 ENCOUNTER — Ambulatory Visit (INDEPENDENT_AMBULATORY_CARE_PROVIDER_SITE_OTHER): Payer: No Typology Code available for payment source | Admitting: Family Medicine

## 2014-12-28 ENCOUNTER — Encounter: Payer: Self-pay | Admitting: Family Medicine

## 2014-12-28 VITALS — BP 98/70 | HR 76 | Temp 97.9°F | Resp 18 | Ht 63.0 in | Wt 130.0 lb

## 2014-12-28 DIAGNOSIS — H109 Unspecified conjunctivitis: Secondary | ICD-10-CM | POA: Diagnosis not present

## 2014-12-28 MED ORDER — ERYTHROMYCIN 5 MG/GM OP OINT: 1.0000 "application " | TOPICAL_OINTMENT | Freq: Every day | OPHTHALMIC | Status: DC

## 2014-12-28 NOTE — Progress Notes (Signed)
  HPI:  ? 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  Past Medical History  Diagnosis Date  . Asthma   . Anemia   . Allergy     No past surgical history on file.  Family History  Problem Relation Age of Onset  . Alcohol abuse Father   . Heart attack Father   . Hypertension    . Hyperlipidemia    . Diabetes Maternal Uncle     History   Social History  . Marital Status: Married    Spouse Name: N/A  . Number of Children: N/A  . Years of Education: N/A   Social History Main Topics  . Smoking status: Never Smoker   . Smokeless tobacco: Not on file  . Alcohol Use: No  . Drug Use: No  . Sexual Activity: Not on file   Other Topics Concern  . None   Social History Narrative     Current outpatient prescriptions:  .  albuterol (PROVENTIL HFA;VENTOLIN HFA) 108 (90 BASE) MCG/ACT inhaler, Inhale 2 puffs into the lungs every 4 (four) hours as needed for wheezing., Disp: , Rfl:  .  Multiple Vitamin (MULTIVITAMIN WITH MINERALS) TABS, Take 1 tablet by mouth daily., Disp: , Rfl:  .  tiZANidine (ZANAFLEX) 2 MG tablet, Take 1 tablet (2 mg total) by mouth at bedtime as needed for muscle spasms., Disp: 20 tablet, Rfl: 0 .  erythromycin ophthalmic ointment, Place 1 application into the left eye at bedtime., Disp: 3.5 g, Rfl: 0  EXAM:  Filed Vitals:   12/28/14 0920  BP: 98/70  Pulse: 76  Temp: 97.9 F (36.6 C)  Resp: 18    Body mass index is 23.03 kg/(m^2).  GENERAL: vitals reviewed and listed above, alert, oriented, appears well hydrated and in no acute distress  HEENT: atraumatic, conjunttiva erythematous L with minimal amount of discharge, PERRLA, vision grossly intact, no obvious abnormalities on inspection of external nose and  ears  NECK: no obvious masses on inspection  PSYCH: pleasant and cooperative, no obvious depression or anxiety  ASSESSMENT AND PLAN:  Discussed the following assessment and plan:  Conjunctivitis of left eye  -compresses -abx eye oint -letter for work -return and optho precautions -Patient advised to return or notify a doctor immediately if symptoms worsen or persist or new concerns arise.  There are no Patient Instructions on file for this visit.   Kriste Basque R.

## 2014-12-28 NOTE — Patient Instructions (Signed)
Compresses  Eye ointment before bed

## 2014-12-28 NOTE — Progress Notes (Signed)
Pre visit review using our clinic review tool, if applicable. No additional management support is needed unless otherwise documented below in the visit note. 

## 2015-01-30 ENCOUNTER — Ambulatory Visit (INDEPENDENT_AMBULATORY_CARE_PROVIDER_SITE_OTHER): Payer: No Typology Code available for payment source | Admitting: Internal Medicine

## 2015-01-30 ENCOUNTER — Encounter: Payer: Self-pay | Admitting: Internal Medicine

## 2015-01-30 VITALS — BP 104/70 | HR 89 | Temp 99.9°F | Ht 63.0 in | Wt 132.0 lb

## 2015-01-30 DIAGNOSIS — J069 Acute upper respiratory infection, unspecified: Secondary | ICD-10-CM

## 2015-01-30 DIAGNOSIS — J3089 Other allergic rhinitis: Secondary | ICD-10-CM | POA: Diagnosis not present

## 2015-01-30 DIAGNOSIS — J4520 Mild intermittent asthma, uncomplicated: Secondary | ICD-10-CM

## 2015-01-30 NOTE — Progress Notes (Signed)
Pre visit review using our clinic review tool, if applicable. No additional management support is needed unless otherwise documented below in the visit note. 

## 2015-01-30 NOTE — Progress Notes (Signed)
Subjective:    Patient ID: Julie Lozano, female    DOB: 08/04/1985, 29 y.o.   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  She presents with a four-day history of sinus congestion, drainage, fever, achiness and nonproductive cough.  Fever has been as high as 101.  She has had some mild sputum production of yellow mucus.  Past Medical History  Diagnosis Date  . Asthma   . Anemia   . Allergy     Social History   Social History  . Marital Status: Married    Spouse Name: N/A  . Number of Children: N/A  . Years of Education: N/A   Occupational History  . Not on file.   Social History Main Topics  . Smoking status: Never Smoker   . Smokeless tobacco: Not on file  . Alcohol Use: No  . Drug Use: No  . Sexual Activity: Not on file   Other Topics Concern  . Not on file   Social History Narrative    No past surgical history on file.  Family History  Problem Relation Age of Onset  . Alcohol abuse Father   . Heart attack Father   . Hypertension    . Hyperlipidemia    . Diabetes Maternal Uncle     Allergies  Allergen Reactions  . Bee Venom   . Red Dye Diarrhea, Itching and Nausea Only  . Shellfish Allergy     Current Outpatient Prescriptions on File Prior to Visit  Medication Sig Dispense Refill  . albuterol (PROVENTIL HFA;VENTOLIN HFA) 108 (90 BASE) MCG/ACT inhaler Inhale 2 puffs into the lungs every 4 (four) hours as needed for wheezing.     No current facility-administered medications on file prior to visit.    BP 104/70 mmHg  Pulse 89  Temp(Src) 99.9 F (37.7 C) (Oral)  Ht  (1.6 m)  Wt 132 lb (59.875 kg)  BMI 23.39 kg/m2  SpO2 98%     Review of Systems  Constitutional: Positive for fever, activity change, appetite change and fatigue. Negative for chills and diaphoresis.  HENT: Positive for congestion, postnasal drip, sinus pressure and sore throat. Negative for dental problem, hearing  loss, rhinorrhea and tinnitus.   Eyes: Negative for pain, discharge and visual disturbance.  Respiratory: Positive for cough. Negative for shortness of breath and wheezing.   Cardiovascular: Negative for chest pain, palpitations and leg swelling.  Gastrointestinal: Negative for nausea, vomiting, abdominal pain, diarrhea, constipation, blood in stool and abdominal distention.  Genitourinary: Negative for dysuria, urgency, frequency, hematuria, flank pain, vaginal bleeding, vaginal discharge, difficulty urinating, vaginal pain and pelvic pain.  Musculoskeletal: Negative for joint swelling, arthralgias and gait problem.  Skin: Negative for rash.  Neurological: Negative for dizziness, syncope, speech difficulty, weakness, numbness and headaches.  Hematological: Negative for adenopathy.  Psychiatric/Behavioral: Negative for behavioral problems, dysphoric mood and agitation. The patient is not nervous/anxious.        Objective:   Physical Exam  Constitutional: She is oriented to person, place, and time. She appears well-developed and well-nourished.  HENT:  Head: Normocephalic.  Right Ear: External ear normal.  Left Ear: External ear normal.  mild erythema of the oropharynx  Eyes: Conjunctivae and EOM are normal. Pupils are equal, round, and reactive to light.  Neck: Normal range of motion. Neck supple. No thyromegaly present.  Cardiovascular: Normal rate, regular rhythm, normal heart sounds and intact distal pulses.   Pulmonary/Chest: Effort normal and  breath sounds normal. No respiratory distress. She has no wheezes. She has no rales.  Abdominal: Soft. Bowel sounds are normal. She exhibits no mass. There is no tenderness.  Musculoskeletal: Normal range of motion.  Lymphadenopathy:    She has no cervical adenopathy.  Neurological: She is alert and oriented to person, place, and time.  Skin: Skin is warm and dry. No rash noted.  Psychiatric: She has a normal mood and affect. Her behavior is  normal.          Assessment & Plan:   Viral URI Asthma Mild pharyngitis  Will treat symptomatically

## 2015-01-30 NOTE — Patient Instructions (Signed)
TREATMENT  Most cases of acute sinusitis are related to a viral infection and will resolve on their own within 10 days. Sometimes medicines are prescribed to help relieve symptoms (pain medicine, decongestants, nasal steroid sprays, or saline sprays).   HOME CARE INSTRUCTIONS  Drink plenty of water. Water helps thin the mucus so your sinuses can drain more easily.  Use a humidifier.  Inhale steam 3 to 4 times a day (for example, sit in the bathroom with the shower running).  Apply a warm, moist washcloth to your face 3 to 4 times a day, or as directed by your health care provider.  Use saline nasal sprays to help moisten and clean your sinuses.  Take medicines only as directed by your health care provider.  If you were prescribed either an antibiotic or antifungal medicine, finish it all even if you start to feel better. SEEK IMMEDIATE MEDICAL CARE IF:  You have increasing pain or severe headaches.  You have nausea, vomiting, or drowsiness.  You have swelling around your face.  You have vision problems.  You have a stiff neck.  You have difficulty breathing.

## 2016-07-20 ENCOUNTER — Ambulatory Visit (INDEPENDENT_AMBULATORY_CARE_PROVIDER_SITE_OTHER): Payer: BLUE CROSS/BLUE SHIELD | Admitting: Adult Health

## 2016-07-20 ENCOUNTER — Encounter: Payer: Self-pay | Admitting: Adult Health

## 2016-07-20 VITALS — BP 118/74 | Temp 98.2°F | Wt 150.8 lb

## 2016-07-20 DIAGNOSIS — J0100 Acute maxillary sinusitis, unspecified: Secondary | ICD-10-CM

## 2016-07-20 DIAGNOSIS — Z7689 Persons encountering health services in other specified circumstances: Secondary | ICD-10-CM | POA: Diagnosis not present

## 2016-07-20 MED ORDER — PREDNISONE 10 MG PO TABS: ORAL_TABLET | ORAL | 0 refills | Status: DC

## 2016-07-20 MED ORDER — DOXYCYCLINE HYCLATE 100 MG PO CAPS: 100.0000 mg | ORAL_CAPSULE | Freq: Two times a day (BID) | ORAL | 0 refills | Status: DC

## 2016-07-20 NOTE — Progress Notes (Signed)
Patient presents to clinic today to establish care. She is a pleasant 31 year old female who  has a past medical history of Allergy; Anemia; and Asthma.   Her last physical was done in 2014 by GYN    Acute Concerns: Establish Care   Sinusitis - She was seen at Urgent Care for the complaint of sore throat. She was prescribed Cefuroxime 250mg  but was told to stop taking it when her strep test came back negative two days ago. Today in the office she reports that she continues to have a sore throat with swollen tonsils, sinus pain and pressure,    Chronic Issues: None   Health Maintenance: Dental -- Routine  Vision -- Routine  Immunizations --UTD  PAP -- 2015 or 2016  Diet: Vegetarian. Tries to eat healthy  Exercise: Does not do regular exercise outside of work.   GYN - Dr. Emilee Hero .    Past Medical History:  Diagnosis Date  . Allergy   . Anemia   . Asthma     No past surgical history on file.  Current Outpatient Prescriptions on File Prior to Visit  Medication Sig Dispense Refill  . albuterol (PROVENTIL HFA;VENTOLIN HFA) 108 (90 BASE) MCG/ACT inhaler Inhale 2 puffs into the lungs every 4 (four) hours as needed for wheezing.     No current facility-administered medications on file prior to visit.     Allergies  Allergen Reactions  . Bee Venom   . Dust Mite Extract   . Mold Extract [Trichophyton]   . Red Dye Diarrhea, Itching and Nausea Only  . Shellfish Allergy   . Tree Extract     Family History  Problem Relation Age of Onset  . Alcohol abuse Father   . Heart attack Father   . Hypertension    . Hyperlipidemia    . Diabetes Maternal Uncle     Social History   Social History  . Marital status: Married    Spouse name: N/A  . Number of children: N/A  . Years of education: N/A   Occupational History  . Not on file.   Social History Main Topics  . Smoking status: Never Smoker  . Smokeless tobacco: Not on file  . Alcohol use No  . Drug use: No  .  Sexual activity: Not on file   Other Topics Concern  . Not on file   Social History Narrative  . No narrative on file    Review of Systems  Constitutional: Negative.   HENT: Positive for sinus pain and sore throat.   Respiratory: Negative.   Cardiovascular: Negative.   Skin: Negative.   Neurological: Positive for headaches.  All other systems reviewed and are negative.   BP 118/74   Temp 98.2 F (36.8 C) (Oral)   Wt 150 lb 12.8 oz (68.4 kg)   BMI 26.71 kg/m   Physical Exam  Constitutional: She is oriented to person, place, and time and well-developed, well-nourished, and in no distress. No distress.  HENT:  Right Ear: Hearing, external ear and ear canal normal. Tympanic membrane is bulging. Tympanic membrane is not erythematous.  Left Ear: Hearing, external ear and ear canal normal. Tympanic membrane is bulging. Tympanic membrane is not erythematous.  Nose: Mucosal edema and rhinorrhea present. Right sinus exhibits maxillary sinus tenderness and frontal sinus tenderness. Left sinus exhibits maxillary sinus tenderness and frontal sinus tenderness.  Mouth/Throat: Uvula is midline and mucous membranes are normal. Posterior oropharyngeal erythema present.  Eyes: Conjunctivae and  EOM are normal. Pupils are equal, round, and reactive to light. Right eye exhibits no discharge. Left eye exhibits no discharge. No scleral icterus.  Cardiovascular: Normal rate, regular rhythm and intact distal pulses.  Exam reveals no gallop and no friction rub.   No murmur heard. Pulmonary/Chest: Effort normal and breath sounds normal. No respiratory distress. She has no wheezes. She has no rales. She exhibits no tenderness.  Neurological: She is alert and oriented to person, place, and time. She has normal reflexes. She displays normal reflexes. No cranial nerve deficit. She exhibits normal muscle tone. Gait normal. Coordination normal. GCS score is 15.  Skin: Skin is warm and dry. No rash noted. She is  not diaphoretic. No erythema. No pallor.  Psychiatric: Mood, memory, affect and judgment normal.  Nursing note and vitals reviewed.   Assessment/Plan: 1. Encounter to establish care - Follow up for CPE or sooner if needed - Work on adding a regular exercise program to her daily routine    2. Acute non-recurrent maxillary sinusitis - predniSONE (DELTASONE) 10 MG tablet; 40 mg x 3 days, 20 mg x 3 days, 10 mg x 3 days  Dispense: 21 tablet; Refill: 0 - doxycycline (VIBRAMYCIN) 100 MG capsule; Take 1 capsule (100 mg total) by mouth 2 (two) times daily.  Dispense: 14 capsule; Refill: 0 - Follow up if no improvement  Shirline Freesory Yoshika Vensel, NP

## 2016-08-06 ENCOUNTER — Other Ambulatory Visit (INDEPENDENT_AMBULATORY_CARE_PROVIDER_SITE_OTHER): Payer: BLUE CROSS/BLUE SHIELD

## 2016-08-06 DIAGNOSIS — Z0000 Encounter for general adult medical examination without abnormal findings: Secondary | ICD-10-CM | POA: Diagnosis not present

## 2016-08-06 LAB — CBC WITH DIFFERENTIAL/PLATELET
BASOS PCT: 1.1 % (ref 0.0–3.0)
Basophils Absolute: 0.1 10*3/uL (ref 0.0–0.1)
EOS ABS: 0.2 10*3/uL (ref 0.0–0.7)
Eosinophils Relative: 3.4 % (ref 0.0–5.0)
HCT: 38.7 % (ref 36.0–46.0)
Hemoglobin: 12.4 g/dL (ref 12.0–15.0)
Lymphocytes Relative: 36.6 % (ref 12.0–46.0)
Lymphs Abs: 2.4 10*3/uL (ref 0.7–4.0)
MCHC: 31.9 g/dL (ref 30.0–36.0)
MCV: 78.8 fl (ref 78.0–100.0)
MONO ABS: 0.4 10*3/uL (ref 0.1–1.0)
Monocytes Relative: 6.8 % (ref 3.0–12.0)
NEUTROS PCT: 52.1 % (ref 43.0–77.0)
Neutro Abs: 3.4 10*3/uL (ref 1.4–7.7)
Platelets: 322 10*3/uL (ref 150.0–400.0)
RBC: 4.91 Mil/uL (ref 3.87–5.11)
RDW: 14.1 % (ref 11.5–15.5)
WBC: 6.5 10*3/uL (ref 4.0–10.5)

## 2016-08-06 LAB — TSH: TSH: 0.7 u[IU]/mL (ref 0.35–4.50)

## 2016-08-06 LAB — LIPID PANEL
CHOLESTEROL: 127 mg/dL (ref 0–200)
HDL: 64.6 mg/dL (ref >39.00)
LDL Cholesterol: 51 mg/dL (ref 0–99)
NonHDL: 62.32
TRIGLYCERIDES: 59 mg/dL (ref 0.0–149.0)
Total CHOL/HDL Ratio: 2
VLDL: 11.8 mg/dL (ref 0.0–40.0)

## 2016-08-06 LAB — HEPATIC FUNCTION PANEL
ALT: 18 U/L (ref 0–35)
AST: 19 U/L (ref 0–37)
Albumin: 4 g/dL (ref 3.5–5.2)
Alkaline Phosphatase: 46 U/L (ref 39–117)
BILIRUBIN DIRECT: 0.1 mg/dL (ref 0.0–0.3)
BILIRUBIN TOTAL: 0.6 mg/dL (ref 0.2–1.2)
Total Protein: 7 g/dL (ref 6.0–8.3)

## 2016-08-06 LAB — POC URINALSYSI DIPSTICK (AUTOMATED)
Bilirubin, UA: NEGATIVE
Blood, UA: NEGATIVE
Glucose, UA: NEGATIVE
KETONES UA: NEGATIVE
LEUKOCYTES UA: NEGATIVE
NITRITE UA: NEGATIVE
PROTEIN UA: NEGATIVE
Spec Grav, UA: 1.02
Urobilinogen, UA: 0.2
pH, UA: 7

## 2016-08-06 LAB — BASIC METABOLIC PANEL
BUN: 6 mg/dL (ref 6–23)
CO2: 28 mEq/L (ref 19–32)
CREATININE: 0.7 mg/dL (ref 0.40–1.20)
Calcium: 9.3 mg/dL (ref 8.4–10.5)
Chloride: 106 mEq/L (ref 96–112)
GFR: 127.25 mL/min (ref >60.00)
Glucose, Bld: 95 mg/dL (ref 70–99)
Potassium: 4.2 mEq/L (ref 3.5–5.1)
Sodium: 139 mEq/L (ref 135–145)

## 2016-08-13 ENCOUNTER — Ambulatory Visit (INDEPENDENT_AMBULATORY_CARE_PROVIDER_SITE_OTHER): Payer: BLUE CROSS/BLUE SHIELD | Admitting: Adult Health

## 2016-08-13 VITALS — BP 110/80 | Temp 98.5°F | Ht 63.0 in | Wt 149.0 lb

## 2016-08-13 DIAGNOSIS — J4520 Mild intermittent asthma, uncomplicated: Secondary | ICD-10-CM

## 2016-08-13 DIAGNOSIS — Z0000 Encounter for general adult medical examination without abnormal findings: Secondary | ICD-10-CM

## 2016-08-13 DIAGNOSIS — N61 Mastitis without abscess: Secondary | ICD-10-CM

## 2016-08-13 MED ORDER — CEPHALEXIN 500 MG PO CAPS: 500.0000 mg | ORAL_CAPSULE | Freq: Four times a day (QID) | ORAL | 0 refills | Status: AC

## 2016-08-13 MED ORDER — ALBUTEROL SULFATE HFA 108 (90 BASE) MCG/ACT IN AERS: 2.0000 | INHALATION_SPRAY | RESPIRATORY_TRACT | 0 refills | Status: AC | PRN

## 2016-08-13 NOTE — Progress Notes (Signed)
Subjective:    Patient ID: Julie JenkinsJessica Rouillard, female    DOB: 12-17-1985, 31 y.o.   MRN: [id number]  HPI  Patient presents for yearly preventative medicine examination. She is a pleasant 31 year old female who  has a past medical history of Allergy; Anemia; Asthma; and Sickle cell trait (HCC).  All immunizations and health maintenance protocols were reviewed with the patient and needed orders were placed. She is up to date on her vaccinations.   Appropriate screening laboratory values were ordered for the patient including screening of hyperlipidemia, renal function and hepatic function.  Medication reconciliation,  past medical history, social history, problem list and allergies were reviewed in detail with the patient  Goals were established with regard to weight loss, exercise, and  diet in compliance with medications. She is a vegetarian. Does not exercise on a regular basis .   She is seen by Dr. Emilee HeroLatham for her GYN needs. She does do routine dental and vision care.   She has an acute complaint of right sided breast tenderness. She reports having a piercing that appears to be infected. She reports drainage, tenderness, and  foul odor. This has been apparent for one week. She does report that her piercing got caught on her bra and may have caused tissue damage   Review of Systems  Constitutional: Negative.   HENT: Negative.   Eyes: Negative.   Respiratory: Negative.   Cardiovascular: Negative.   Gastrointestinal: Negative.   Endocrine: Negative.   Genitourinary: Negative.   Musculoskeletal: Negative.   Skin: Negative.   Allergic/Immunologic: Negative.   Neurological: Negative.   Hematological: Negative.   Psychiatric/Behavioral: Negative.   All other systems reviewed and are negative.  Past Medical History:  Diagnosis Date  . Allergy   . Anemia   . Asthma   . Sickle cell trait [facility](HCC)     Social History   Social History  . Marital status: Married    Spouse name: N/A   . Number of children: N/A  . Years of education: N/A   Occupational History  . Not on file.   Social History Main Topics  . Smoking status: Never Smoker  . Smokeless tobacco: Not on file  . Alcohol use 3.6 oz/week    6 Glasses of wine per week  . Drug use: No  . Sexual activity: Not on file   Other Topics Concern  . 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 WISDOM TOOTH EXTRACTION      Family History  Problem Relation Age of Onset  . Alcohol abuse Father   . Heart attack Father   . Stroke Father   . Hypertension    . Hyperlipidemia    . Diabetes Maternal Uncle   . Colon cancer Maternal Grandfather   . High blood pressure Maternal Grandfather   . Diabetes Maternal Grandfather     Allergies  Allergen Reactions  . Bee Venom   . Dust Mite Extract   . Mold Extract [Trichophyton]   . Red Dye Diarrhea, Itching and Nausea Only  . Shellfish Allergy   . Tree Extract     Current Outpatient Prescriptions on File Prior to Visit  Medication Sig Dispense Refill  . albuterol (PROVENTIL HFA;VENTOLIN HFA) 108 (90 BASE) MCG/ACT inhaler Inhale 2 puffs into  the lungs every 4 (four) hours as needed for wheezing.    Marland Kitchen doxycycline (VIBRAMYCIN) 100 MG capsule Take 1 capsule (100 mg total) by mouth 2 (two) times daily. 14 capsule 0  . predniSONE (DELTASONE) 10 MG tablet 40 mg x 3 days, 20 mg x 3 days, 10 mg x 3 days 21 tablet 0   No current facility-administered medications on file prior to visit.     There were no vitals taken for this visit.      Objective:   Physical Exam  Constitutional: She is oriented to person, place, and time. She appears well-developed and well-nourished. No distress.  HENT:  Head: Normocephalic and atraumatic.  Right Ear: External ear normal.  Left Ear: External ear normal.  Nose: Nose normal.  Mouth/Throat:  Oropharynx is clear and moist. No oropharyngeal exudate.  Eyes: Conjunctivae and EOM are normal. Right eye exhibits no discharge. Left eye exhibits no discharge. No scleral icterus.  Neck: Normal range of motion. Neck supple. No JVD present. No tracheal deviation present. No thyromegaly present.  Cardiovascular: Normal rate, regular rhythm, normal heart sounds and intact distal pulses.  Exam reveals no gallop and no friction rub.   No murmur heard. Pulmonary/Chest: Effort normal and breath sounds normal. No stridor. No respiratory distress. She has no wheezes. She has no rales. She exhibits no tenderness.  Abdominal: Soft. Bowel sounds are normal. She exhibits no distension and no mass. There is no tenderness. There is no rebound and no guarding.  Musculoskeletal: Normal range of motion. She exhibits no edema, tenderness or deformity.  Right nipple tender with palpation. No drainage noted. No redness or swelling. Crusting noted on right nipple piercing   Lymphadenopathy:    She has no cervical adenopathy.  Neurological: She is alert and oriented to person, place, and time. She has normal reflexes. She displays normal reflexes. No cranial nerve deficit. She exhibits normal muscle tone. Coordination normal.  Skin: Skin is warm and dry. No rash noted. She is not diaphoretic. No erythema. No pallor.  Psychiatric: She has a normal mood and affect. Her behavior is normal. Judgment and thought content normal.  Nursing note and vitals reviewed.     Assessment & Plan:  1. Routine general medical examination at a health care facility - Reviewed labs with patient and all questions answered.  - Her labs are all WNL - Since she is vegetarian advised about the importance of getting protein in her diet.  - Follow up in one year or sooner if needed  2. Nipple infection - Will treat with dose of Keflex. Follow up if no improvement after antibiotic treatment. Make sure to keep piercing clean  - cephALEXin  (KEFLEX) 500 MG capsule; Take 1 capsule (500 mg total) by mouth 4 (four) times daily.  Dispense: 28 capsule; Refill: 0  3. Mild intermittent asthma without complication  - albuterol (PROVENTIL HFA;VENTOLIN HFA) 108 (90 Base) MCG/ACT inhaler; Inhale 2 puffs into the lungs every 4 (four) hours as needed for wheezing.  Dispense: 6.7 g; Refill: 0   Shirline Frees, NP

## 2016-08-13 NOTE — Patient Instructions (Addendum)
I have sent in a prescription for Keflex. Take this four times per day for 7 days   Follow up if no improvement

## 2016-11-19 ENCOUNTER — Ambulatory Visit (INDEPENDENT_AMBULATORY_CARE_PROVIDER_SITE_OTHER): Payer: BLUE CROSS/BLUE SHIELD | Admitting: Adult Health

## 2016-11-19 VITALS — BP 124/70 | Temp 98.6°F | Ht 63.0 in | Wt 161.5 lb

## 2016-11-19 DIAGNOSIS — G43811 Other migraine, intractable, with status migrainosus: Secondary | ICD-10-CM | POA: Diagnosis not present

## 2016-11-19 DIAGNOSIS — J3489 Other specified disorders of nose and nasal sinuses: Secondary | ICD-10-CM

## 2016-11-19 DIAGNOSIS — N61 Mastitis without abscess: Secondary | ICD-10-CM

## 2016-11-19 MED ORDER — DOXYCYCLINE HYCLATE 100 MG PO CAPS: 100.0000 mg | ORAL_CAPSULE | Freq: Two times a day (BID) | ORAL | 0 refills | Status: DC

## 2016-11-19 NOTE — Progress Notes (Signed)
Subjective:    Patient ID: Julie Lozano, female    DOB: 1985-12-30, 31 y.o.   MRN: [id number]  HPI  31 year old female who presents to the clinic today for multiple complaints   1. Headaches - She has been experiencing episodic headaches for the last few months. She has not gone a week in multiple months where she does not have a headache. The headache is located across the front of her head and described as a dull throbbing pain. She denies any photo phobia does have phonophobia and movement causes worsening pain. She has been using oxycodone which will help for a short period of time. She denies any changes in vision   2. Infection from nipple rings.  - She was treated for this back in march 2018 with Keflex, she reports that she was only able to take a few days of antibiotics but she stopped because " they made me feel weird". She never followed up after stopping these medications   3. 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 She has been using OTC allergy medication but feels as though " my nose gets stuffy and I have a hard time breathing through my nose.   Review of Systems See HPI   Past Medical History:  Diagnosis Date  . Allergy   . Anemia   . Asthma   . Sickle cell trait [facility])     Social History   Social History  . Marital status: Married    Spouse name: N/A  . Number of children: N/A  . Years of education: N/A   Occupational History  . Not on file.   Social History Main Topics  . Smoking status: Never Smoker  . Smokeless tobacco: Not on file  . Alcohol use 3.6 oz/week    6 Glasses of wine per week  . Drug use: No  . Sexual activity: Not on file   Other Topics Concern  . 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 WISDOM TOOTH EXTRACTION      Family History  Problem  Relation Age of Onset  . Alcohol abuse Father   . Heart attack Father   . Stroke Father   . Hypertension Unknown   . Hyperlipidemia Unknown   . Diabetes Maternal Uncle   . Colon cancer Maternal Grandfather   . High blood pressure Maternal Grandfather   . Diabetes Maternal Grandfather     Allergies  Allergen Reactions  . Bee Venom   . Dust Mite Extract   . Mold Extract [Trichophyton]   . Red Dye Diarrhea, Itching and Nausea Only  . Shellfish Allergy   . Tree Extract     Current Outpatient Prescriptions on File Prior to Visit  Medication Sig Dispense Refill  . albuterol (PROVENTIL HFA;VENTOLIN HFA) 108 (90 Base) MCG/ACT inhaler Inhale 2 puffs into the lungs every 4 (four) hours as needed for wheezing. 6.7 g 0   No current facility-administered medications on file prior to visit.     BP 124/70 (BP Location: Left Arm, Patient Position: Sitting, Cuff Size: Normal)   Temp 98.6 F (37 C) (Oral)   Ht 5\' 3"  (1.6 m)   Wt 161 lb 8 oz (73.3 kg)   BMI 28.61 kg/m  Objective:   Physical Exam  Constitutional: She is oriented to person, place, and time. She appears well-developed and well-nourished. No distress.  HENT:  Head: Normocephalic and atraumatic.  Right Ear: External ear normal.  Left Ear: External ear normal.  Nose: Nose normal.  Mouth/Throat: Oropharynx is clear and moist. No oropharyngeal exudate.  severely enlarged inferior turbinate - bilateral   Cardiovascular: Normal rate, regular rhythm, normal heart sounds and intact distal pulses.  Exam reveals no gallop and no friction rub.   No murmur heard. Pulmonary/Chest: Effort normal and breath sounds normal. No respiratory distress. She has no wheezes. She has no rales. She exhibits no tenderness.  Neurological: She is alert and oriented to person, place, and time.  Skin: Skin is warm and dry. No rash noted. She is not diaphoretic. No erythema. No pallor.  She does have a small amount of purulent discharge from  bilateral nipples. Discharge noted on nipple rings.   Psychiatric: She has a normal mood and affect. Her behavior is normal. Judgment and thought content normal.  Nursing note and vitals reviewed.     Assessment & Plan:  1. Other migraine with status migrainosus, intractable - She responded well to high flow oxygen in the office. After 10 minutes her headache was gone. We spoke about starting daily medication for migraines. She would like to hold off on this for now   2. Deviation of inferior turbinate  - Ambulatory referral to ENT  3. Nipple infection  - doxycycline (VIBRAMYCIN) 100 MG capsule; Take 1 capsule (100 mg total) by mouth 2 (two) times daily.  Dispense: 14 capsule; Refill: 0  Julie Freesory Keilynn Marano, NP

## 2019-05-08 ENCOUNTER — Emergency Department (HOSPITAL_COMMUNITY): Payer: BLUE CROSS/BLUE SHIELD

## 2019-05-08 ENCOUNTER — Observation Stay (HOSPITAL_COMMUNITY)
Admission: EM | Admit: 2019-05-08 | Discharge: 2019-05-09 | Disposition: A | Discharge status: Home / Self Care | Payer: BLUE CROSS/BLUE SHIELD | Attending: Internal Medicine | Admitting: Internal Medicine

## 2019-05-08 ENCOUNTER — Encounter (HOSPITAL_COMMUNITY): Payer: Self-pay | Admitting: Radiology

## 2019-05-08 ENCOUNTER — Other Ambulatory Visit: Payer: Self-pay

## 2019-05-08 DIAGNOSIS — D573 Sickle-cell trait: Secondary | ICD-10-CM | POA: Insufficient documentation

## 2019-05-08 DIAGNOSIS — E320 Persistent hyperplasia of thymus: Secondary | ICD-10-CM

## 2019-05-08 DIAGNOSIS — R509 Fever, unspecified: Secondary | ICD-10-CM

## 2019-05-08 DIAGNOSIS — Z20828 Contact with and (suspected) exposure to other viral communicable diseases: Secondary | ICD-10-CM

## 2019-05-08 DIAGNOSIS — R110 Nausea: Secondary | ICD-10-CM | POA: Diagnosis not present

## 2019-05-08 DIAGNOSIS — J45909 Unspecified asthma, uncomplicated: Secondary | ICD-10-CM | POA: Insufficient documentation

## 2019-05-08 DIAGNOSIS — U071 COVID-19: Secondary | ICD-10-CM | POA: Diagnosis not present

## 2019-05-08 DIAGNOSIS — R197 Diarrhea, unspecified: Secondary | ICD-10-CM | POA: Insufficient documentation

## 2019-05-08 DIAGNOSIS — R591 Generalized enlarged lymph nodes: Secondary | ICD-10-CM

## 2019-05-08 DIAGNOSIS — Z20822 Contact with and (suspected) exposure to covid-19: Secondary | ICD-10-CM

## 2019-05-08 LAB — I-STAT BETA HCG BLOOD, ED (MC, WL, AP ONLY): I-stat hCG, quantitative: <5 m[IU]/mL (ref <5)

## 2019-05-08 LAB — CBC WITH DIFFERENTIAL/PLATELET
Abs Immature Granulocytes: 0.01 10*3/uL (ref 0.00–0.07)
Basophils Absolute: 0.1 10*3/uL (ref 0.0–0.1)
Basophils Relative: 1 %
Eosinophils Absolute: 0.1 10*3/uL (ref 0.0–0.5)
Eosinophils Relative: 2 %
HCT: 37.3 % (ref 36.0–46.0)
Hemoglobin: 12.2 g/dL (ref 12.0–15.0)
Immature Granulocytes: 0 %
Lymphocytes Relative: 4 %
Lymphs Abs: 0.3 10*3/uL — ABNORMAL LOW (ref 0.7–4.0)
MCH: 25.1 pg — ABNORMAL LOW (ref 26.0–34.0)
MCHC: 32.7 g/dL (ref 30.0–36.0)
MCV: 76.7 fL — ABNORMAL LOW (ref 80.0–100.0)
Monocytes Absolute: 0.7 10*3/uL (ref 0.1–1.0)
Monocytes Relative: 11 %
Neutro Abs: 4.9 10*3/uL (ref 1.7–7.7)
Neutrophils Relative %: 82 %
Platelets: 306 10*3/uL (ref 150–400)
RBC: 4.86 MIL/uL (ref 3.87–5.11)
RDW: 12.8 % (ref 11.5–15.5)
WBC: 6 10*3/uL (ref 4.0–10.5)
nRBC: 0 % (ref 0.0–0.2)

## 2019-05-08 LAB — URINALYSIS, ROUTINE W REFLEX MICROSCOPIC
Bilirubin Urine: NEGATIVE
Glucose, UA: NEGATIVE mg/dL
Hgb urine dipstick: NEGATIVE
Ketones, ur: NEGATIVE mg/dL
Leukocytes,Ua: NEGATIVE
Nitrite: NEGATIVE
Protein, ur: NEGATIVE mg/dL
Specific Gravity, Urine: 1.003 — ABNORMAL LOW (ref 1.005–1.030)
pH: 6 (ref 5.0–8.0)

## 2019-05-08 LAB — COMPREHENSIVE METABOLIC PANEL
ALT: 19 U/L (ref 0–44)
AST: 24 U/L (ref 15–41)
Albumin: 3.7 g/dL (ref 3.5–5.0)
Alkaline Phosphatase: 45 U/L (ref 38–126)
Anion gap: 8 (ref 5–15)
BUN: 10 mg/dL (ref 6–20)
CO2: 25 mmol/L (ref 22–32)
Calcium: 9.6 mg/dL (ref 8.9–10.3)
Chloride: 103 mmol/L (ref 98–111)
Creatinine, Ser: 0.86 mg/dL (ref 0.44–1.00)
GFR calc Af Amer: >60 mL/min (ref >60)
GFR calc non Af Amer: >60 mL/min (ref >60)
Glucose, Bld: 105 mg/dL — ABNORMAL HIGH (ref 70–99)
Potassium: 3.9 mmol/L (ref 3.5–5.1)
Sodium: 136 mmol/L (ref 135–145)
Total Bilirubin: 0.9 mg/dL (ref 0.3–1.2)
Total Protein: 7.4 g/dL (ref 6.5–8.1)

## 2019-05-08 LAB — POC SARS CORONAVIRUS 2 AG -  ED: SARS Coronavirus 2 Ag: NEGATIVE

## 2019-05-08 MED ORDER — KETOROLAC TROMETHAMINE 15 MG/ML IJ SOLN
15.0000 mg | Freq: Once | INTRAMUSCULAR | Status: AC
  Administered 2019-05-08: 22:00:00 15 mg via INTRAVENOUS
  Filled 2019-05-08: qty 1

## 2019-05-08 MED ORDER — PROCHLORPERAZINE EDISYLATE 10 MG/2ML IJ SOLN
10.0000 mg | Freq: Once | INTRAMUSCULAR | Status: AC
  Administered 2019-05-08: 22:00:00 10 mg via INTRAVENOUS
  Filled 2019-05-08: qty 2

## 2019-05-08 MED ORDER — DIPHENHYDRAMINE HCL 50 MG/ML IJ SOLN
25.0000 mg | Freq: Once | INTRAMUSCULAR | Status: AC
  Administered 2019-05-09: 01:00:00 25 mg via INTRAVENOUS
  Filled 2019-05-08: qty 1

## 2019-05-08 MED ORDER — METOCLOPRAMIDE HCL 5 MG/ML IJ SOLN
20.0000 mg | Freq: Once | INTRAVENOUS | Status: AC
  Administered 2019-05-09: 01:00:00 20 mg via INTRAVENOUS
  Filled 2019-05-08: qty 4

## 2019-05-08 MED ORDER — SODIUM CHLORIDE 0.9 % IV BOLUS
1000.0000 mL | Freq: Once | INTRAVENOUS | Status: AC
  Administered 2019-05-08: 22:00:00 1000 mL via INTRAVENOUS

## 2019-05-08 MED ORDER — SODIUM CHLORIDE 0.9 % IV BOLUS
1000.0000 mL | Freq: Once | INTRAVENOUS | Status: AC
  Administered 2019-05-09: 1000 mL via INTRAVENOUS

## 2019-05-08 MED ORDER — SODIUM CHLORIDE 0.9 % IV BOLUS
1000.0000 mL | Freq: Once | INTRAVENOUS | Status: AC
  Administered 2019-05-08: 23:00:00 1000 mL via INTRAVENOUS

## 2019-05-08 MED ORDER — ACETAMINOPHEN 325 MG PO TABS
650.0000 mg | ORAL_TABLET | Freq: Once | ORAL | Status: AC | PRN
  Administered 2019-05-08: 19:00:00 650 mg via ORAL
  Filled 2019-05-08: qty 2

## 2019-05-08 NOTE — ED Triage Notes (Signed)
Pt to ED with c/o headache x's 7 days.  St's today she has developed a headache, weakness and fever  Last took Tylenol at 1:00

## 2019-05-08 NOTE — ED Provider Notes (Signed)
[facility] Provider Note   CSN: [id number] Arrival date & time: 05/08/19  1808     History   Chief Complaint Chief Complaint  Patient presents with  . ? Covid    HPI Julie Lozano is a 33 y.o. female.     HPI Patient presents with concern of fever, headache, nausea. Patient states that she is generally well, was well prior to about 1 week ago. 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 She is unaware of other possible sick contacts. Now, over the course of the week patient has had a persistent headache, chills, fever, with new development of nausea, loose stool, polyuria. No focal pain, no focal weakness, no confusion, disorientation, vision changes. Patient has had a small amount of discharge from the left nipple piercing, but no erythema, no persistent purulence.  Past Medical History:  Diagnosis Date  . Allergy   . Anemia   . Asthma   . Sickle cell trait [facility])     Patient Active Problem List   Diagnosis Date Noted  . Pain in left foot 12/21/2014  . Neck pain 08/30/2012  . Eczema 08/30/2012  . ANEMIA-NOS 09/08/2008  . Allergic rhinitis 09/08/2008  . Asthma 08/29/2008    Past Surgical History:  Procedure Laterality Date  . WISDOM TOOTH EXTRACTION       OB History   No obstetric history on file.      Home Medications    Prior to Admission medications   Medication Sig Start Date End Date Taking? Authorizing Provider  albuterol (PROVENTIL HFA;VENTOLIN HFA) 108 (90 Base) MCG/ACT inhaler Inhale 2 puffs into the lungs every 4 (four) hours as needed for wheezing. 08/13/16  Yes Nafziger, Tommi Rumps, NP  ELDERBERRY PO Take 1 tablet by mouth daily.   Yes [provider]  EPINEPHrine 0.3 mg/0.3 mL IJ SOAJ injection Inject 0.3 mg into the muscle daily as needed for anaphylaxis.   Yes [provider]  Multiple Vitamin (MULTIVITAMIN WITH MINERALS) TABS tablet Take 1  tablet by mouth daily.   Yes [provider]  doxycycline (VIBRAMYCIN) 100 MG capsule Take 1 capsule (100 mg total) by mouth 2 (two) times daily. Patient not taking: Reported on 05/08/2019 11/19/16   Dorothyann Peng, NP    Family History Family History  Problem Relation Age of Onset  . Alcohol abuse Father   . Heart attack Father   . Stroke Father   . Hypertension Unknown   . Hyperlipidemia Unknown   . Diabetes Maternal Uncle   . Colon cancer Maternal Grandfather   . High blood pressure Maternal Grandfather   . Diabetes Maternal Grandfather     Social History Social History   Tobacco Use  . Smoking status: Never Smoker  Substance Use Topics  . Alcohol use: Yes    Alcohol/week: 6.0 standard drinks    Types: 6 Glasses of wine per week  . Drug use: No     Allergies   Mold extract [trichophyton], Shellfish allergy, Bee venom, Dust mite extract, Red dye, and Tree extract   Review of Systems Review of Systems  Constitutional:       Per HPI, otherwise negative  HENT:       Per HPI, otherwise negative  Respiratory:       Per HPI, otherwise negative  Cardiovascular:       Per HPI, otherwise negative  Gastrointestinal: Negative for vomiting.  Endocrine:  Negative aside from HPI  Genitourinary:       Neg aside from HPI   Musculoskeletal:       Per HPI, otherwise negative  Skin: Negative.   Neurological: Positive for headaches. Negative for syncope.     Physical Exam Updated Vital Signs BP 102/60 (BP Location: Right Arm)   Pulse 98   Temp 98.5 F (36.9 C) (Oral)   Resp 17   Ht 5\' 3"  (1.6 m)   Wt 64.9 kg   LMP 03/31/2019   SpO2 97%   BMI 25.33 kg/m   Physical Exam Vitals signs and nursing note reviewed.  Constitutional:      General: She is not in acute distress.    Appearance: She is well-developed.  HENT:     Head: Normocephalic and atraumatic.  Eyes:     Conjunctiva/sclera: Conjunctivae normal.  Cardiovascular:     Rate and Rhythm:  Regular rhythm. Tachycardia present.  Pulmonary:     Effort: Pulmonary effort is normal. No respiratory distress.     Breath sounds: Normal breath sounds. No stridor.  Chest:    Abdominal:     General: There is no distension.     Tenderness: There is no abdominal tenderness.  Skin:    General: Skin is warm and dry.  Neurological:     Mental Status: She is alert and oriented to person, place, and time.     Cranial Nerves: No cranial nerve deficit.      ED Treatments / Results  Labs (all labs ordered are listed, but only abnormal results are displayed) Labs Reviewed  COMPREHENSIVE METABOLIC PANEL - Abnormal; Notable for the following components:      Result Value   Glucose, Bld 105 (*)    All other components within normal limits  CBC WITH DIFFERENTIAL/PLATELET - Abnormal; Notable for the following components:   MCV 76.7 (*)    MCH 25.1 (*)    Lymphs Abs 0.3 (*)    All other components within normal limits  URINALYSIS, ROUTINE W REFLEX MICROSCOPIC - Abnormal; Notable for the following components:   APPearance HAZY (*)    Specific Gravity, Urine 1.003 (*)    All other components within normal limits  I-STAT BETA HCG BLOOD, ED ([facility], [facility], [facility] ONLY)  POC SARS CORONAVIRUS 2 AG -  ED    EKG EKG Interpretation  Date/Time:  Tuesday May 08 2019 18:24:45 EST Ventricular Rate:  141 PR Interval:    QRS Duration: 84 QT Interval:  360 QTC Calculation: 551 R Axis:   101 Text Interpretation: Sinus tachycardia Rightward axis Low voltage QRS ST-t wave abnormality Abnormal ECG Confirmed by Gerhard MunchLockwood, Joseguadalupe Stan [phone]([id number]) on 05/08/2019 8:23:29 PM   Radiology Dg Chest Port 1 View  Result Date: 05/08/2019 CLINICAL DATA:  Fever. Additional history provided: Headaches for 7 days, fever EXAM: PORTABLE CHEST 1 VIEW COMPARISON:  Chest radiograph 12/18/2008 FINDINGS: Heart size within normal limits. There is no airspace consolidation within the lungs. No evidence of pleural effusion or  pneumothorax. No acute bony abnormality. IMPRESSION: No evidence of acute cardiopulmonary abnormality. Electronically Signed   By: Jackey LogeKyle  Golden DO   On: 05/08/2019 21:48    Procedures Procedures (including critical care time)  Medications Ordered in ED Medications  sodium chloride 0.9 % bolus 1,000 mL (has no administration in time range)  diphenhydrAMINE (BENADRYL) injection 25 mg (has no administration in time range)  metoCLOPramide (REGLAN) 20 mg in dextrose 5 % 50 mL IVPB (has no administration in time  range)  acetaminophen (TYLENOL) tablet 650 mg (650 mg Oral Given 05/08/19 1833)  sodium chloride 0.9 % bolus 1,000 mL (0 mLs Intravenous Stopped 05/08/19 2149)  ketorolac (TORADOL) 15 MG/ML injection 15 mg (15 mg Intravenous Given 05/08/19 2131)  prochlorperazine (COMPAZINE) injection 10 mg (10 mg Intravenous Given 05/08/19 2132)  sodium chloride 0.9 % bolus 1,000 mL (1,000 mLs Intravenous Bolus from Bag 05/08/19 2257)     Initial Impression / Assessment and Plan / ED Course  I have reviewed the triage vital signs and the nursing notes.  Pertinent labs & imaging results that were available during my care of the patient were reviewed by me and considered in my medical decision making (see chart for details).        11:11 PM On repeat exam the patient had initially stated that she was better, but she is now accompanied by her mother, states that she does not feel better.  On however, fever has resolved, tachycardia has resolved, blood pressure is better. She notes now that her headache is worse with lying supine. With persistent discomfort, no prior imaging, the patient will have head CT, additional labs, fluids, Reglan, Benadryl all pending.  This adult female presents with headache, fever, chills per Patient has no meningismus, no neurologic dysfunction, no vision changes, no evidence for meningitis, suspicion for viral etiology.  No evidence for pneumonia, labs reassuring, no evidence  for bacteremia, sepsis. Patient awaiting CT scan, additional therapy, will require repeat evaluation.  Dr. Nicanor Alcon is aware of the patient.  Final Clinical Impressions(s) / ED Diagnoses   Fever in adult Bad headache   Gerhard Munch, MD 05/08/19 2314

## 2019-05-09 ENCOUNTER — Ambulatory Visit (HOSPITAL_COMMUNITY): Payer: BLUE CROSS/BLUE SHIELD

## 2019-05-09 ENCOUNTER — Emergency Department (HOSPITAL_COMMUNITY): Payer: BLUE CROSS/BLUE SHIELD

## 2019-05-09 ENCOUNTER — Other Ambulatory Visit (HOSPITAL_COMMUNITY): Payer: BLUE CROSS/BLUE SHIELD

## 2019-05-09 DIAGNOSIS — R509 Fever, unspecified: Secondary | ICD-10-CM

## 2019-05-09 LAB — CBC
HCT: 33.4 % — ABNORMAL LOW (ref 36.0–46.0)
Hemoglobin: 10.7 g/dL — ABNORMAL LOW (ref 12.0–15.0)
MCH: 25.6 pg — ABNORMAL LOW (ref 26.0–34.0)
MCHC: 32 g/dL (ref 30.0–36.0)
MCV: 79.9 fL — ABNORMAL LOW (ref 80.0–100.0)
Platelets: 269 10*3/uL (ref 150–400)
RBC: 4.18 MIL/uL (ref 3.87–5.11)
RDW: 13.2 % (ref 11.5–15.5)
WBC: 3.5 10*3/uL — ABNORMAL LOW (ref 4.0–10.5)
nRBC: 0 % (ref 0.0–0.2)

## 2019-05-09 LAB — FIBRINOGEN: Fibrinogen: 189 mg/dL — ABNORMAL LOW (ref 210–475)

## 2019-05-09 LAB — COMPREHENSIVE METABOLIC PANEL
ALT: 15 U/L (ref 0–44)
AST: 20 U/L (ref 15–41)
Albumin: 3 g/dL — ABNORMAL LOW (ref 3.5–5.0)
Alkaline Phosphatase: 36 U/L — ABNORMAL LOW (ref 38–126)
Anion gap: 8 (ref 5–15)
BUN: 8 mg/dL (ref 6–20)
CO2: 20 mmol/L — ABNORMAL LOW (ref 22–32)
Calcium: 8.1 mg/dL — ABNORMAL LOW (ref 8.9–10.3)
Chloride: 110 mmol/L (ref 98–111)
Creatinine, Ser: 0.78 mg/dL (ref 0.44–1.00)
GFR calc Af Amer: >60 mL/min (ref >60)
GFR calc non Af Amer: >60 mL/min (ref >60)
Glucose, Bld: 112 mg/dL — ABNORMAL HIGH (ref 70–99)
Potassium: 4.1 mmol/L (ref 3.5–5.1)
Sodium: 138 mmol/L (ref 135–145)
Total Bilirubin: 0.9 mg/dL (ref 0.3–1.2)
Total Protein: 5.9 g/dL — ABNORMAL LOW (ref 6.5–8.1)

## 2019-05-09 LAB — SARS CORONAVIRUS 2 (TAT 6-24 HRS): SARS Coronavirus 2: POSITIVE — AB

## 2019-05-09 LAB — TSH: TSH: 0.515 u[IU]/mL (ref 0.350–4.500)

## 2019-05-09 LAB — C-REACTIVE PROTEIN: CRP: 0.7 mg/dL (ref <1.0)

## 2019-05-09 LAB — LACTIC ACID, PLASMA: Lactic Acid, Venous: 0.7 mmol/L (ref 0.5–1.9)

## 2019-05-09 LAB — FERRITIN: Ferritin: 43 ng/mL (ref 11–307)

## 2019-05-09 LAB — SEDIMENTATION RATE: Sed Rate: 7 mm/hr (ref 0–22)

## 2019-05-09 LAB — LACTATE DEHYDROGENASE: LDH: 120 U/L (ref 98–192)

## 2019-05-09 LAB — HIV ANTIBODY (ROUTINE TESTING W REFLEX): HIV Screen 4th Generation wRfx: NONREACTIVE

## 2019-05-09 LAB — TRIGLYCERIDES: Triglycerides: 24 mg/dL (ref <150)

## 2019-05-09 LAB — D-DIMER, QUANTITATIVE: D-Dimer, Quant: 0.34 ug/mL-FEU (ref 0.00–0.50)

## 2019-05-09 LAB — PROCALCITONIN: Procalcitonin: 0.14 ng/mL

## 2019-05-09 MED ORDER — IOHEXOL 350 MG/ML SOLN
75.0000 mL | Freq: Once | INTRAVENOUS | Status: AC | PRN
  Administered 2019-05-09: 75 mL via INTRAVENOUS

## 2019-05-09 MED ORDER — ACETAMINOPHEN 325 MG PO TABS: 650.0000 mg | ORAL_TABLET | Freq: Four times a day (QID) | ORAL | Status: AC | PRN

## 2019-05-09 MED ORDER — ALBUTEROL SULFATE HFA 108 (90 BASE) MCG/ACT IN AERS: 2.0000 | INHALATION_SPRAY | RESPIRATORY_TRACT | Status: DC | PRN

## 2019-05-09 MED ORDER — ENOXAPARIN SODIUM 40 MG/0.4ML ~~LOC~~ SOLN: 40.0000 mg | Freq: Every day | SUBCUTANEOUS | Status: DC

## 2019-05-09 MED ORDER — ACETAMINOPHEN 650 MG RE SUPP: 650.0000 mg | Freq: Four times a day (QID) | RECTAL | Status: DC | PRN

## 2019-05-09 MED ORDER — ACETAMINOPHEN 500 MG PO TABS
1000.0000 mg | ORAL_TABLET | Freq: Once | ORAL | Status: AC
  Administered 2019-05-09: 03:00:00 1000 mg via ORAL
  Filled 2019-05-09: qty 2

## 2019-05-09 MED ORDER — ACETAMINOPHEN 325 MG PO TABS: 650.0000 mg | ORAL_TABLET | Freq: Four times a day (QID) | ORAL | Status: DC | PRN

## 2019-05-09 MED ORDER — MAGNESIUM SULFATE 2 GM/50ML IV SOLN
2.0000 g | Freq: Once | INTRAVENOUS | Status: AC
  Administered 2019-05-09: 01:00:00 2 g via INTRAVENOUS
  Filled 2019-05-09: qty 50

## 2019-05-09 MED ORDER — SODIUM CHLORIDE 0.9 % IV SOLN
INTRAVENOUS | Status: DC
  Administered 2019-05-09: 05:00:00 via INTRAVENOUS

## 2019-05-09 NOTE — H&P (Signed)
[facility] H&P    Julie Lozano Demographics:    Julie Lozano, is a 33 y.o. female  MRN: [id number]  DOB - 1985/11/15  Admit Date - 05/08/2019  Referring MD/NP/PA: April Palumbo  Outpatient Primary MD for the Julie Lozano is Julie Lozano, No Pcp Per  Julie Lozano coming from:  home  Chief complaint-  headache   HPI:    Julie Lozano  is a 33 y.o. female,  w sickle cell trait, anemia, asthma, apparently presents due to headache. N/v, diarrhea and fever.   In ED,   T103.1, P 131, R 19, Bp 99/72  Pox99% on RA Wt 64.9kg  CTA chest FINDINGS: Cardiovascular: There are no filling defects within the pulmonary arteries to suggest pulmonary embolus. Thoracic aorta is normal in caliber. No aortic dissection. Heart is normal in size. There is no pericardial effusion.  Mediastinum/Nodes: No enlarged mediastinal or hilar lymph nodes. Triangular soft tissue density in the anterior mediastinum is most consistent with residual or recurrent thymus. Few symmetric bilateral prominent axillary nodes, likely reactive. Decompressed esophagus. No visualized thyroid nodule.  Lungs/Pleura: Lungs are clear. No focal airspace disease. No pleural fluid. No pulmonary edema. The trachea and mainstem bronchi are patent.  Upper Abdomen: No acute abnormality.  Musculoskeletal: Negative. There are no acute or suspicious osseous abnormalities. Bilateral nipple rings noted.  Review of the MIP images confirms the above findings.  IMPRESSION: No pulmonary embolus or acute intrathoracic abnormality.  CT brain IMPRESSION: 1. Negative intracranial. 2. Mucosal thickening of scattered ethmoid air cells. No sinus fluid levels.  Na 136, K 3.9,  Bun 10, Creatinin e0.86 Ast 24, Alt 19 Wbc 6.0, hgb 12.2, Plt 306 Urinalysis negative  poc sars negative  Pt states that headache resolved  Pt will be admitted for fever unclear source   Review of  systems:    In addition to the HPI above,     No changes with Vision or hearing, No problems swallowing food or Liquids, No Chest pain, Cough or Shortness of Breath, No Abdominal pain,  No Blood in stool or Urine, No dysuria, No new skin rashes or bruises, No new joints pains-aches,  No new weakness, tingling, numbness in any extremity, No recent weight gain or loss, No polyuria, polydypsia or polyphagia, No significant Mental Stressors.  All other systems reviewed and are negative.    Past History of the following :    Past Medical History:  Diagnosis Date   Allergy    Anemia    Asthma    Sickle cell trait ([place])       Past Surgical History:  Procedure Laterality Date   WISDOM TOOTH EXTRACTION        Social History:      Social History   Tobacco Use   Smoking status: Never Smoker  Substance Use Topics   Alcohol use: Yes    Alcohol/week: 6.0 standard drinks    Types: 6 Glasses of wine per week       Family History :     Family History  Problem Relation Age of  Onset   Alcohol abuse Father    Heart attack Father    Stroke Father    Hypertension Unknown    Hyperlipidemia Unknown    Diabetes Maternal Uncle    Colon cancer Maternal Grandfather    High blood pressure Maternal Grandfather    Diabetes Maternal Grandfather        Home Medications:   Prior to Admission medications   Medication Sig Start Date End Date Taking? Authorizing Provider  albuterol (PROVENTIL HFA;VENTOLIN HFA) 108 (90 Base) MCG/ACT inhaler Inhale 2 puffs into the lungs every 4 (four) hours as needed for wheezing. 08/13/16  Yes Nafziger, Tommi Rumps, NP  ELDERBERRY PO Take 1 tablet by mouth daily.   Yes [provider]  EPINEPHrine 0.3 mg/0.3 mL IJ SOAJ injection Inject 0.3 mg into the muscle daily as needed for anaphylaxis.   Yes [provider]  Multiple Vitamin (MULTIVITAMIN WITH MINERALS) TABS tablet Take 1 tablet by mouth daily.   Yes [provider]  doxycycline (VIBRAMYCIN) 100 MG capsule Take 1 capsule (100 mg total) by mouth 2 (two) times daily. Julie Lozano not taking: Reported on 05/08/2019 11/19/16   Dorothyann Peng, NP     Allergies:     Allergies  Allergen Reactions   Mold Extract [Trichophyton] Shortness Of Breath   Shellfish Allergy Anaphylaxis   Bee Venom Swelling   Dust Mite Extract Other (See Comments)    Seasonal allergies    Red Dye Diarrhea, Itching and Nausea Only   Tree Extract Other (See Comments)    unknown     Physical Exam:   Vitals  Blood pressure 109/66, pulse (!) 114, temperature 99.4 F (37.4 C), temperature source Oral, resp. rate 17, height '5\' 3"'  (1.6 m), weight 64.9 kg, last menstrual period 03/31/2019, SpO2 100 %.  1.  General: axoxo3  2. Psychiatric: euthymic  3. Neurologic: Nonfocal, cn2-12 intact, reflexes 2+ symmetric, diffuse with no clonus, motor 5/5 in all 4 ext  4. HEENMT:  Anicteric, pupils 1.54m symmetric, direct, consensual, near intact Neck: no jvd, supple  5. Respiratory : CTAB  6. Cardiovascular : Tachy s1, s2,   7. Gastrointestinal:  Abd: soft, nt, nd, +bs  8. Skin:  Ext: no c/c/e,  No rash  9.Musculoskeletal:  Good ROM    Data Review:    CBC Recent Labs  Lab 05/08/19 2117  WBC 6.0  HGB 12.2  HCT 37.3  PLT 306  MCV 76.7*  MCH 25.1*  MCHC 32.7  RDW 12.8  LYMPHSABS 0.3*  MONOABS 0.7  EOSABS 0.1  BASOSABS 0.1   ------------------------------------------------------------------------------------------------------------------  Results for orders placed or performed during the hospital encounter of 05/08/19 (from the past 48 hour(s))  Comprehensive metabolic panel     Status: Abnormal   Collection Time: 05/08/19  9:17 PM  Result Value Ref Range   Sodium 136 135 - 145 mmol/L   Potassium 3.9 3.5 - 5.1 mmol/L   Chloride 103 98 - 111 mmol/L   CO2 25 22 - 32 mmol/L   Glucose, Bld 105 (H) 70 - 99 mg/dL   BUN 10 6 - 20 mg/dL    Creatinine, Ser 0.86 0.44 - 1.00 mg/dL   Calcium 9.6 8.9 - 10.3 mg/dL   Total Protein 7.4 6.5 - 8.1 g/dL   Albumin 3.7 3.5 - 5.0 g/dL   AST 24 15 - 41 U/L   ALT 19 0 - 44 U/L   Alkaline Phosphatase 45 38 - 126 U/L   Total Bilirubin 0.9 0.3 -  1.2 mg/dL   GFR calc non Af Amer >60 >60 mL/min   GFR calc Af Amer >60 >60 mL/min   Anion gap 8 5 - 15    Comment: Performed at [facility] [address] [place], [place] [address]  CBC with Differential     Status: Abnormal   Collection Time: 05/08/19  9:17 PM  Result Value Ref Range   WBC 6.0 4.0 - 10.5 K/uL   RBC 4.86 3.87 - 5.11 MIL/uL   Hemoglobin 12.2 12.0 - 15.0 g/dL   HCT 37.3 36.0 - 46.0 %   MCV 76.7 (L) 80.0 - 100.0 fL   MCH 25.1 (L) 26.0 - 34.0 pg   MCHC 32.7 30.0 - 36.0 g/dL   RDW 12.8 11.5 - 15.5 %   Platelets 306 150 - 400 K/uL   nRBC 0.0 0.0 - 0.2 %   Neutrophils Relative % 82 %   Neutro Abs 4.9 1.7 - 7.7 K/uL   Lymphocytes Relative 4 %   Lymphs Abs 0.3 (L) 0.7 - 4.0 K/uL   Monocytes Relative 11 %   Monocytes Absolute 0.7 0.1 - 1.0 K/uL   Eosinophils Relative 2 %   Eosinophils Absolute 0.1 0.0 - 0.5 K/uL   Basophils Relative 1 %   Basophils Absolute 0.1 0.0 - 0.1 K/uL   Immature Granulocytes 0 %   Abs Immature Granulocytes 0.01 0.00 - 0.07 K/uL    Comment: Performed at [facility] [address], [place], [place] [address]  Urinalysis, Routine w reflex microscopic     Status: Abnormal   Collection Time: 05/08/19  9:17 PM  Result Value Ref Range   Color, Urine YELLOW YELLOW   APPearance HAZY (A) CLEAR   Specific Gravity, Urine 1.003 (L) 1.005 - 1.030   pH 6.0 5.0 - 8.0   Glucose, UA NEGATIVE NEGATIVE mg/dL   Hgb urine dipstick NEGATIVE NEGATIVE   Bilirubin Urine NEGATIVE NEGATIVE   Ketones, ur NEGATIVE NEGATIVE mg/dL   Protein, ur NEGATIVE NEGATIVE mg/dL   Nitrite NEGATIVE NEGATIVE   Leukocytes,Ua NEGATIVE NEGATIVE    Comment: Performed at [facility] [address] [place], [place]  [address]  I-Stat beta hCG blood, ED     Status: None   Collection Time: 05/08/19  9:52 PM  Result Value Ref Range   I-stat hCG, quantitative <5.0 <5 mIU/mL   Comment 3            Comment:   GEST. AGE      CONC.  (mIU/mL)   <=1 WEEK        5 - 50     2 WEEKS       50 - 500     3 WEEKS       100 - 10,000     4 WEEKS     1,000 - 30,000        FEMALE AND NON-PREGNANT FEMALE:     LESS THAN 5 mIU/mL   POC SARS Coronavirus 2 Ag-ED - Nasal Swab (BD Veritor Kit)     Status: None   Collection Time: 05/08/19 10:03 PM  Result Value Ref Range   SARS Coronavirus 2 Ag NEGATIVE NEGATIVE    Comment: (NOTE) SARS-CoV-2 antigen NOT DETECTED.  Negative results are presumptive.  Negative results do not preclude SARS-CoV-2 infection and should not be used as the sole basis for treatment or other Julie Lozano management decisions, including infection  control decisions, particularly in the presence  of clinical signs and  symptoms consistent with COVID-19, or in those who have been in contact with the virus.  Negative results must be combined with clinical observations, Julie Lozano history, and epidemiological information. The expected result is Negative. Fact Sheet for Patients: PodPark.tn Fact Sheet for Healthcare Providers: GiftContent.is This test is not yet approved or cleared by the [place] FDA and  has been authorized for detection and/or diagnosis of SARS-CoV-2 by FDA under an Emergency Use Authorization (EUA).  This EUA will remain in effect (meaning this test can be used) for the duration of  the COVID-19 de claration under Section 564(b)(1) of the Act, 21 U.S.C. section 360bbb-3(b)(1), unless the authorization is terminated or revoked sooner.     Chemistries  Recent Labs  Lab 05/08/19 2117  NA 136  K 3.9  CL 103  CO2 25  GLUCOSE 105*  BUN 10  CREATININE 0.86  CALCIUM 9.6  AST 24  ALT 19  ALKPHOS 45  BILITOT 0.9    ------------------------------------------------------------------------------------------------------------------  ------------------------------------------------------------------------------------------------------------------ GFR: Estimated Creatinine Clearance: 85.9 mL/min (by C-G formula based on SCr of 0.86 mg/dL). Liver Function Tests: Recent Labs  Lab 05/08/19 2117  AST 24  ALT 19  ALKPHOS 45  BILITOT 0.9  PROT 7.4  ALBUMIN 3.7   No results for input(s): LIPASE, AMYLASE in the last 168 hours. No results for input(s): AMMONIA in the last 168 hours. Coagulation Profile: No results for input(s): INR, PROTIME in the last 168 hours. Cardiac Enzymes: No results for input(s): CKTOTAL, CKMB, CKMBINDEX, TROPONINI in the last 168 hours. BNP (last 3 results) No results for input(s): PROBNP in the last 8760 hours. HbA1C: No results for input(s): HGBA1C in the last 72 hours. CBG: No results for input(s): GLUCAP in the last 168 hours. Lipid Profile: No results for input(s): CHOL, HDL, LDLCALC, TRIG, CHOLHDL, LDLDIRECT in the last 72 hours. Thyroid Function Tests: No results for input(s): TSH, T4TOTAL, FREET4, T3FREE, THYROIDAB in the last 72 hours. Anemia Panel: No results for input(s): VITAMINB12, FOLATE, FERRITIN, TIBC, IRON, RETICCTPCT in the last 72 hours.  --------------------------------------------------------------------------------------------------------------- Urine analysis:    Component Value Date/Time   COLORURINE YELLOW 05/08/2019 2117   APPEARANCEUR HAZY (A) 05/08/2019 2117   LABSPEC 1.003 (L) 05/08/2019 2117   PHURINE 6.0 05/08/2019 2117   GLUCOSEU NEGATIVE 05/08/2019 2117   HGBUR NEGATIVE 05/08/2019 2117   BILIRUBINUR NEGATIVE 05/08/2019 2117   BILIRUBINUR N 08/06/2016 [place] 05/08/2019 2117   PROTEINUR NEGATIVE 05/08/2019 2117   UROBILINOGEN 0.2 08/06/2016 1047   NITRITE NEGATIVE 05/08/2019 2117   LEUKOCYTESUR NEGATIVE  05/08/2019 2117      Imaging Results:    Ct Head Wo Contrast  Result Date: 05/08/2019 CLINICAL DATA:  Altered level of consciousness (LOC), unexplained. Julie Lozano represents of worsening headache, weakness and fever. EXAM: CT HEAD WITHOUT CONTRAST TECHNIQUE: Contiguous axial images were obtained from the base of the skull through the vertex without intravenous contrast. COMPARISON:  None. FINDINGS: Brain: No evidence of acute infarction, hemorrhage, hydrocephalus, extra-axial collection or mass lesion/mass effect. Vascular: No hyperdense vessel or unexpected calcification. Skull: Normal. Negative for fracture or focal lesion. Sinuses/Orbits: Scattered mucosal thickening of ethmoid air cells. No sinus fluid levels. Mastoid air cells are clear. Included orbits are unremarkable. Other: None. IMPRESSION: 1. Negative intracranial. 2. Mucosal thickening of scattered ethmoid air cells. No sinus fluid levels. Electronically Signed   By: Keith Rake M.D.   On: 05/08/2019 23:32   Ct Angio Chest Pe W And/or Wo  Contrast  Result Date: 05/09/2019 CLINICAL DATA:  Tachycardia. Fever. EXAM: CT ANGIOGRAPHY CHEST WITH CONTRAST TECHNIQUE: Multidetector CT imaging of the chest was performed using the standard protocol during bolus administration of intravenous contrast. Multiplanar CT image reconstructions and MIPs were obtained to evaluate the vascular anatomy. CONTRAST:  44m OMNIPAQUE IOHEXOL 350 MG/ML SOLN COMPARISON:  Radiograph yesterday. FINDINGS: Cardiovascular: There are no filling defects within the pulmonary arteries to suggest pulmonary embolus. Thoracic aorta is normal in caliber. No aortic dissection. Heart is normal in size. There is no pericardial effusion. Mediastinum/Nodes: No enlarged mediastinal or hilar lymph nodes. Triangular soft tissue density in the anterior mediastinum is most consistent with residual or recurrent thymus. Few symmetric bilateral prominent axillary nodes, likely reactive.  Decompressed esophagus. No visualized thyroid nodule. Lungs/Pleura: Lungs are clear. No focal airspace disease. No pleural fluid. No pulmonary edema. The trachea and mainstem bronchi are patent. Upper Abdomen: No acute abnormality. Musculoskeletal: Negative. There are no acute or suspicious osseous abnormalities. Bilateral nipple rings noted. Review of the MIP images confirms the above findings. IMPRESSION: No pulmonary embolus or acute intrathoracic abnormality. Electronically Signed   By: MKeith RakeM.D.   On: 05/09/2019 03:15   Dg Chest Port 1 View  Result Date: 05/08/2019 CLINICAL DATA:  Fever. Additional history provided: Headaches for 7 days, fever EXAM: PORTABLE CHEST 1 VIEW COMPARISON:  Chest radiograph 12/18/2008 FINDINGS: Heart size within normal limits. There is no airspace consolidation within the lungs. No evidence of pleural effusion or pneumothorax. No acute bony abnormality. IMPRESSION: No evidence of acute cardiopulmonary abnormality. Electronically Signed   By: KKellie SimmeringDO   On: 05/08/2019 21:48       Assessment & Plan:    Active Problems:   Fever  Fever Blood culture x2 Check ESR covid -19 PCR pending, please follow up PUI for now Consider checking influenza  Tachycardia Tele Trop I  TSH pending Check cardiac echo  Thymus Consider CT surgery input in am    DVT Prophylaxis-   Lovenox - SCDs  AM Labs Ordered, also please review Full Orders  Family Communication: Admission, patients condition and plan of care including tests being ordered have been discussed with the Julie Lozano  who indicate understanding and agree with the plan and Code Status.  Code Status:  FULL CODE per Julie Lozano  Admission status: Observation: Based on patients clinical presentation and evaluation of above clinical data, I have made determination that Julie Lozano meets observation criteria at this time.   Time spent in minutes : 55 minutes   JJani GravelM.D on 05/09/2019 at 3:49  AM

## 2019-05-09 NOTE — ED Notes (Signed)
Pt transported to CT ?

## 2019-05-09 NOTE — ED Notes (Signed)
Tele PUI Breakfast ordered  

## 2019-05-10 ENCOUNTER — Telehealth (HOSPITAL_COMMUNITY): Payer: Self-pay

## 2019-05-14 LAB — CULTURE, BLOOD (ROUTINE X 2)
Culture: NO GROWTH
Culture: NO GROWTH

## 2019-07-04 ENCOUNTER — Other Ambulatory Visit: Payer: BLUE CROSS/BLUE SHIELD

## 2020-12-11 ENCOUNTER — Other Ambulatory Visit: Payer: Self-pay

## 2020-12-11 ENCOUNTER — Ambulatory Visit
Admission: EM | Admit: 2020-12-11 | Discharge: 2020-12-11 | Disposition: A | Discharge status: Home / Self Care | Payer: Self-pay | Attending: Emergency Medicine | Admitting: Emergency Medicine

## 2020-12-11 ENCOUNTER — Encounter: Payer: Self-pay | Admitting: Emergency Medicine

## 2020-12-11 DIAGNOSIS — T2112XA Burn of first degree of abdominal wall, initial encounter: Secondary | ICD-10-CM

## 2020-12-11 MED ORDER — SILVER SULFADIAZINE 1 % EX CREA: 1.0000 "application " | TOPICAL_CREAM | Freq: Every day | CUTANEOUS | 0 refills | Status: DC

## 2020-12-11 MED ORDER — IBUPROFEN 800 MG PO TABS: 800.0000 mg | ORAL_TABLET | Freq: Three times a day (TID) | ORAL | 0 refills | Status: DC
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
## 2020-12-11 NOTE — ED Provider Notes (Signed)
[facility]    CSN: [id number] Arrival date & time: 12/11/20  1112      History   Chief Complaint Chief Complaint  Patient presents with   Burn    HPI Julie Lozano is a 35 y.o. female history of asthma presenting today for evaluation of a burn.  Was eating at a restaurant last night and reports that hot water was accidentally spilled on her lap which subsequently caused a burn to her inner thighs groin and vaginal area.  Reports pain has eased off today, but continues to notice some swelling and discomfort.  HPI  Past Medical History:  Diagnosis Date   Allergy    Anemia    Asthma    Sickle cell trait [facility])     Patient Active Problem List   Diagnosis Date Noted   Fever 05/09/2019   Pain in left foot 12/21/2014   Neck pain 08/30/2012   Eczema 08/30/2012   ANEMIA-NOS 09/08/2008   Allergic rhinitis 09/08/2008   Asthma 08/29/2008    Past Surgical History:  Procedure Laterality Date   WISDOM TOOTH EXTRACTION      OB History   No obstetric history on file.      Home Medications    Prior to Admission medications   Medication Sig Start Date End Date Taking? Authorizing Provider  ibuprofen (ADVIL) 800 MG tablet Take 1 tablet (800 mg total) by mouth 3 (three) times daily. 12/11/20  Yes Cale Bethard C, PA-C  silver sulfADIAZINE (SILVADENE) 1 % cream Apply 1 application topically daily. 12/11/20  Yes Fatema Rabe C, PA-C  acetaminophen (TYLENOL) 325 MG tablet Take 2 tablets (650 mg total) by mouth every 6 (six) hours as needed for mild pain (or Fever >/= 101). 05/09/19   Zannie Cove, MD  albuterol (PROVENTIL HFA;VENTOLIN HFA) 108 (90 Base) MCG/ACT inhaler Inhale 2 puffs into the lungs every 4 (four) hours as needed for wheezing. 08/13/16   Nafziger, Kandee Keen, NP  ELDERBERRY PO Take 1 tablet by mouth daily.    [provider]  EPINEPHrine 0.3 mg/0.3 mL IJ SOAJ injection Inject 0.3 mg into the muscle daily as needed for anaphylaxis.    [provider]  Multiple Vitamin (MULTIVITAMIN WITH MINERALS) TABS tablet Take 1 tablet by mouth daily.    [provider]    Family History Family History  Problem Relation Age of Onset   Alcohol abuse Father    Heart attack Father    Stroke Father    Hypertension Unknown    Hyperlipidemia Unknown    Diabetes Maternal Uncle    Colon cancer Maternal Grandfather    High blood pressure Maternal Grandfather    Diabetes Maternal Grandfather     Social History Social History   Tobacco Use   Smoking status: Never   Smokeless tobacco: Never  Substance Use Topics   Alcohol use: Yes    Alcohol/week: 6.0 standard drinks    Types: 6 Glasses of wine per week   Drug use: No     Allergies   Mold extract [trichophyton], Shellfish allergy, Bee venom, Dust mite extract, Red dye, and Tree extract   Review of Systems Review of Systems  Constitutional:  Negative for fatigue and fever.  Eyes:  Negative for visual disturbance.  Respiratory:  Negative for shortness of breath.   Cardiovascular:  Negative for chest pain.  Gastrointestinal:  Negative for abdominal pain, nausea and vomiting.  Musculoskeletal:  Negative for arthralgias and joint swelling.  Skin:  Positive  for color change and wound. Negative for rash.  Neurological:  Negative for dizziness, weakness, light-headedness and headaches.    Physical Exam Triage Vital Signs ED Triage Vitals  Enc Vitals Group     BP      Pulse      Resp      Temp      Temp src      SpO2      Weight      Height      Head Circumference      Peak Flow      Pain Score      Pain Loc      Pain Edu?      Excl. in GC?    No data found.  Updated Vital Signs LMP 12/01/2020   Visual Acuity Right Eye Distance:   Left Eye Distance:   Bilateral Distance:    Right Eye Near:   Left Eye Near:    Bilateral Near:     Physical Exam Vitals and nursing note reviewed.  Constitutional:      Appearance: She is well-developed.      Comments: No acute distress  HENT:     Head: Normocephalic and atraumatic.     Nose: Nose normal.  Eyes:     Conjunctiva/sclera: Conjunctivae normal.  Cardiovascular:     Rate and Rhythm: Normal rate.  Pulmonary:     Effort: Pulmonary effort is normal. No respiratory distress.  Abdominal:     General: There is no distension.  Genitourinary:    Comments: Mons pubis with mild swelling, mild hyperpigmentation, no erythema bulla vesicles or open skin, mild tenderness to palpation over mons, nontender along bilateral labia, mild tenderness to lower gluteal areas extending into inner thighs without significant skin changes Musculoskeletal:        General: Normal range of motion.     Cervical back: Neck supple.  Skin:    General: Skin is warm and dry.  Neurological:     Mental Status: She is alert and oriented to person, place, and time.     UC Treatments / Results  Labs (all labs ordered are listed, but only abnormal results are displayed) Labs Reviewed - No data to display  EKG   Radiology No results found.  Procedures Procedures (including critical care time)  Medications Ordered in UC Medications - No data to display  Initial Impression / Assessment and Plan / UC Course  I have reviewed the triage vital signs and the nursing notes.  Pertinent labs & imaging results that were available during my care of the patient were reviewed by me and considered in my medical decision making (see chart for details).     Superficial/first-degree burn of groin, no open areas of skin, discussed wound care, pain control, recommendations provided.  Monitor for gradual healing.  Discussed strict return precautions. Patient verbalized understanding and is agreeable with plan.  Final Clinical Impressions(s) / UC Diagnoses   Final diagnoses:  Burn of groin, first degree, initial encounter     Discharge Instructions      Please use Tylenol [phone] mg every 4-6 hours, may  supplement with Ibuprofen 600 mg every 8 hours May apply aloe vera for further relief/healing May apply wet gauze to keep cool Silvadene cream daily Do not apply ice Follow-up if not improving or worsening       ED Prescriptions     Medication Sig Dispense Auth. Provider   ibuprofen (ADVIL) 800 MG tablet Take 1  tablet (800 mg total) by mouth 3 (three) times daily. 21 tablet Narayan Scull C, PA-C   silver sulfADIAZINE (SILVADENE) 1 % cream Apply 1 application topically daily. 50 g Kaleb Sek, [place] C, PA-C      PDMP not reviewed this encounter.   Lew Dawes, PA-C 12/11/20 1158

## 2020-12-11 NOTE — Discharge Instructions (Addendum)
Please use Tylenol [phone] mg every 4-6 hours, may supplement with Ibuprofen 600 mg every 8 hours May apply aloe vera for further relief/healing May apply wet gauze to keep cool Silvadene cream daily Do not apply ice Follow-up if not improving or worsening

## 2020-12-30 IMAGING — CT CT ANGIO CHEST
2 of 6 series · 19 of 36 positions shown · IV contrast (omnipaque)
Comparison: Radiograph yesterday.

CLINICAL DATA: Tachycardia. Fever.

EXAM:
CT ANGIOGRAPHY CHEST WITH CONTRAST
TECHNIQUE: Multidetector CT imaging of the chest was performed using the
standard protocol during bolus administration of intravenous
contrast. Multiplanar CT image reconstructions and MIPs were
obtained to evaluate the vascular anatomy.
CONTRAST:  61mL OMNIPAQUE IOHEXOL 350 MG/ML SOLN

[Series 7: pe thins · axial · 0.67mm/px · z∈[+1176,+1423]mm · 18 of 393 slices shown]
[im 20/393  lung]
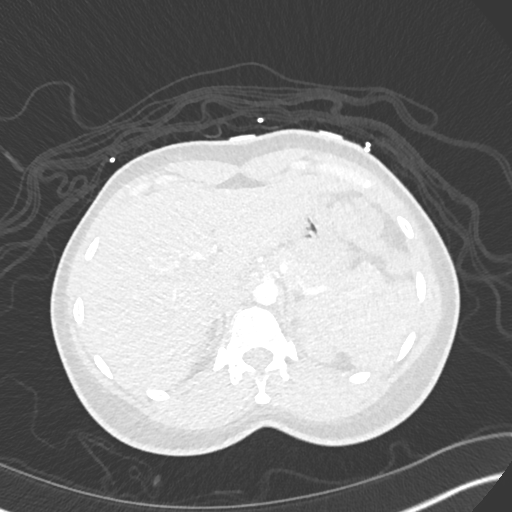
[im 40/393  mediastinal]
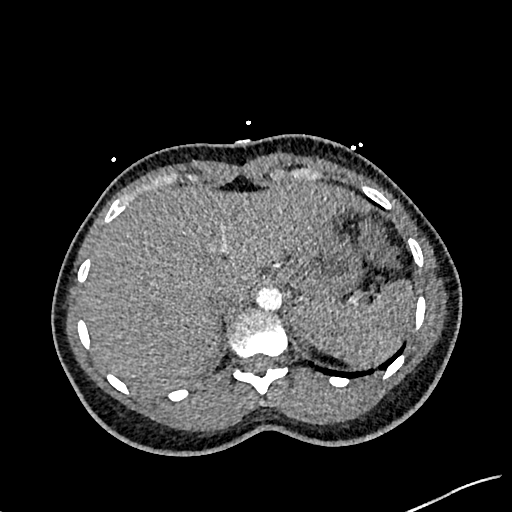
[im 59/393  lung]
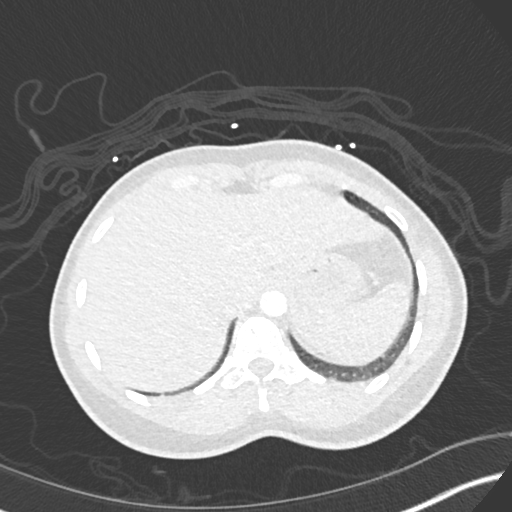
[im 79/393  mediastinal]
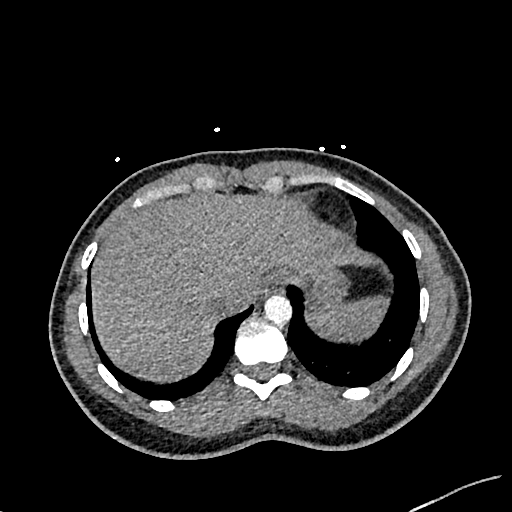
[im 99/393  lung]
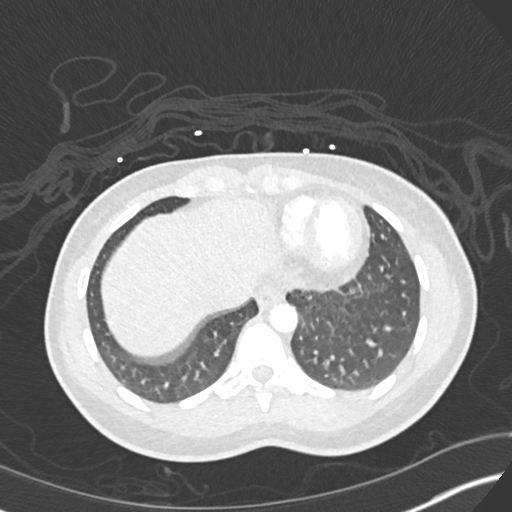
[im 118/393  mediastinal]
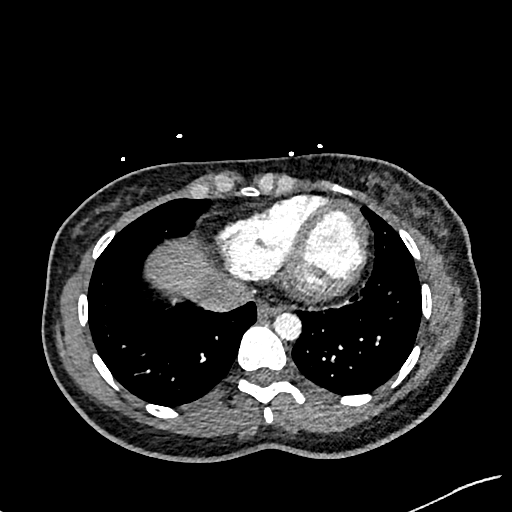
[im 138/393  lung]
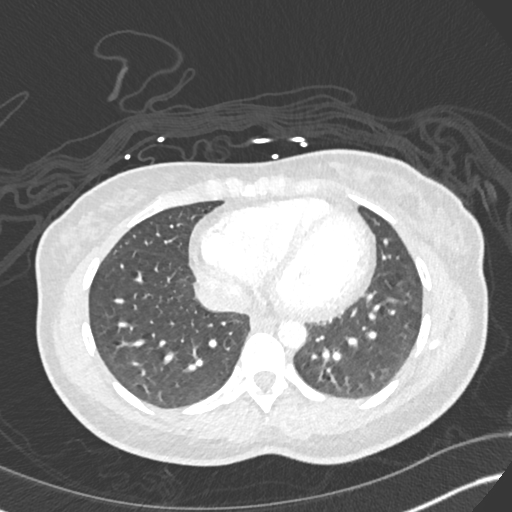
[im 157/393  mediastinal]
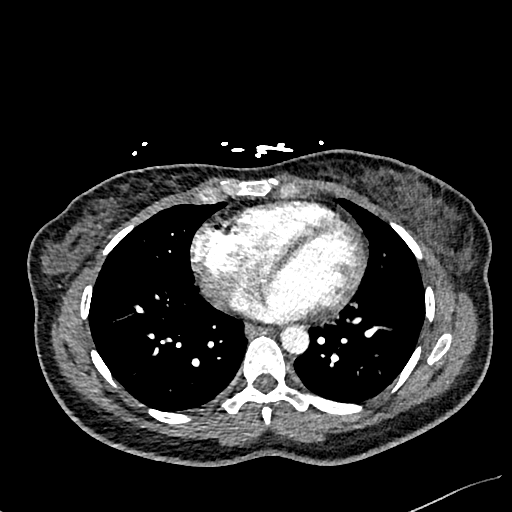
[im 177/393  lung]
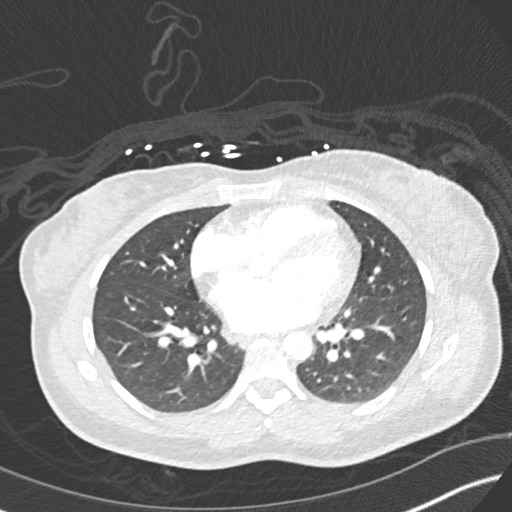
[im 216/393  mediastinal]
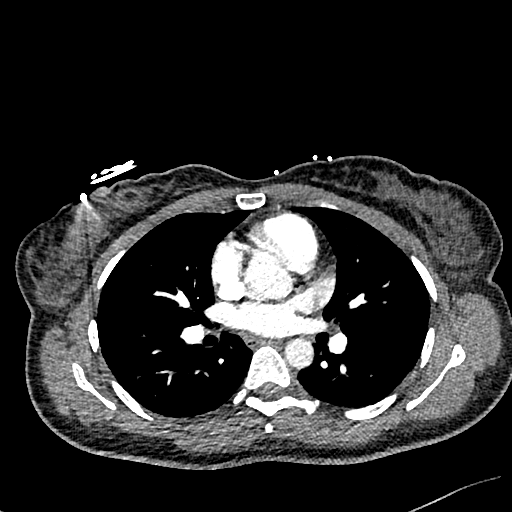
[im 236/393  lung]
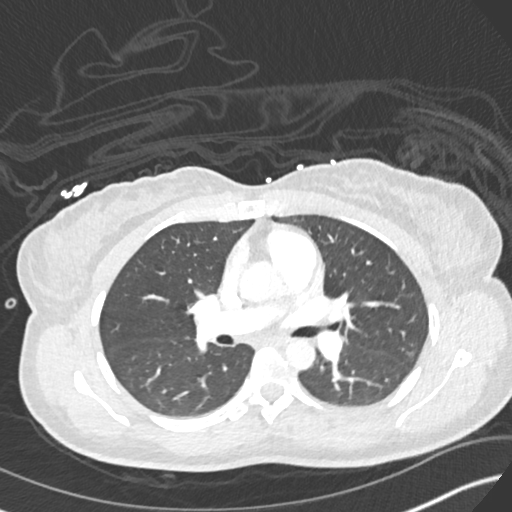
[im 255/393  mediastinal]
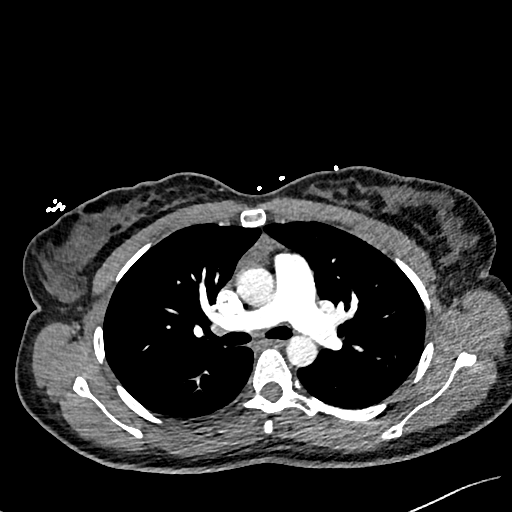
[im 275/393  lung]
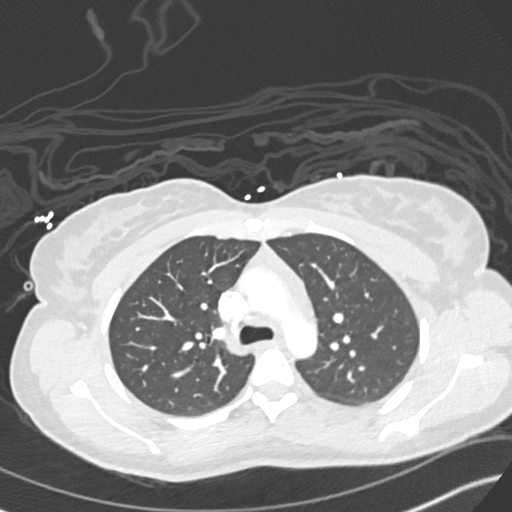
[im 295/393  mediastinal]
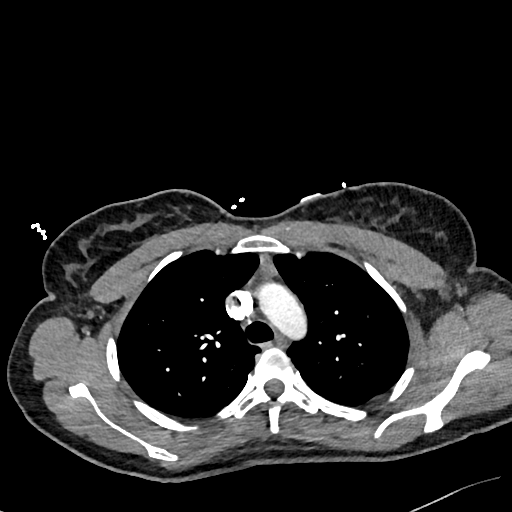
[im 314/393  lung]
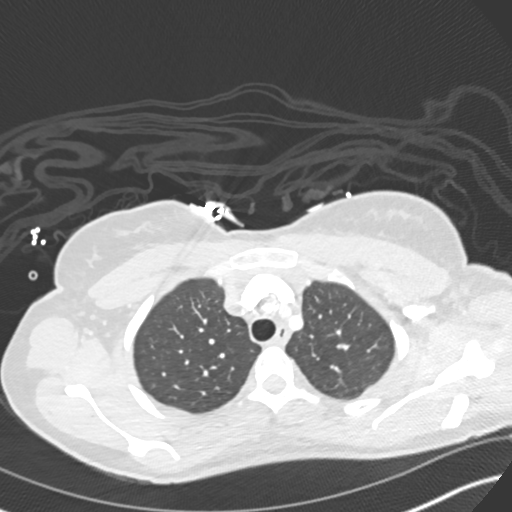
[im 334/393  mediastinal]
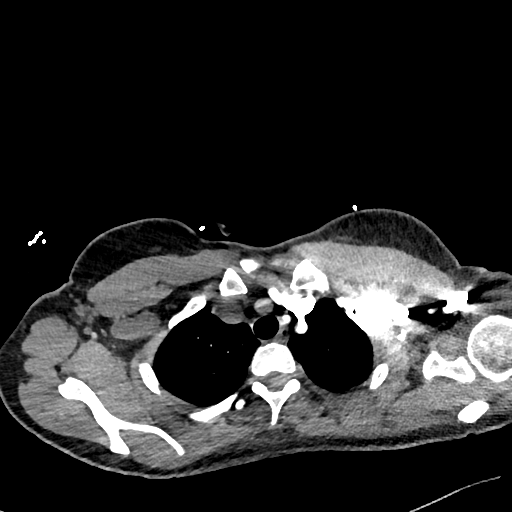
[im 353/393  lung]
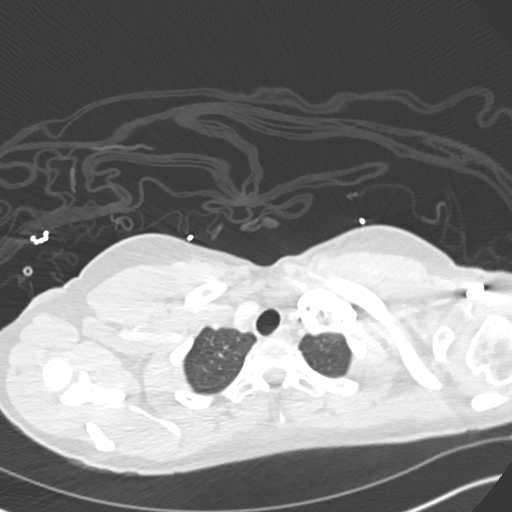
[im 373/393  mediastinal]
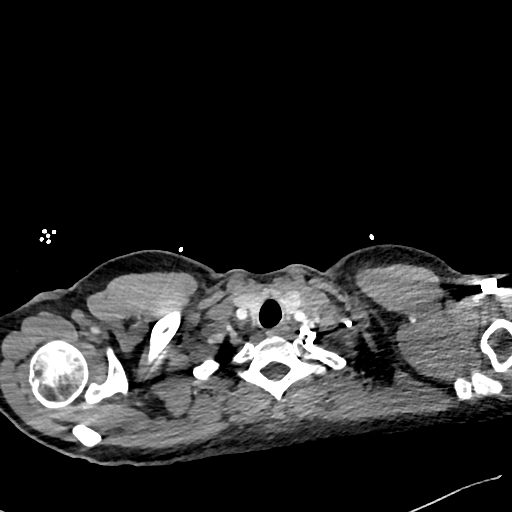

[Series 8: pe 2mm cor · coronal · 0.55mm/px · 1 of 107 slices shown]
[im 54/107  mediastinal]
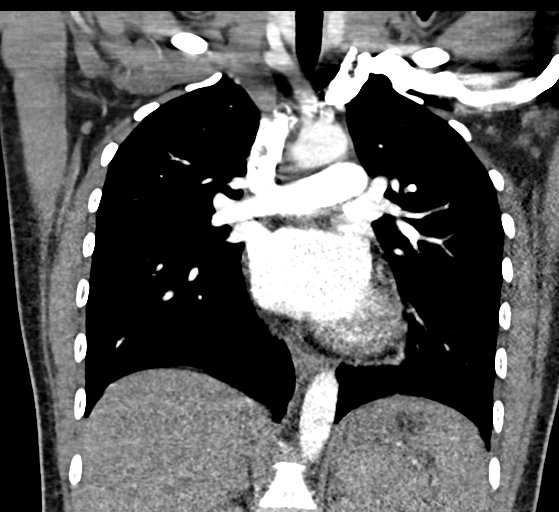

[19 of 36 positions shown; findings below may reference images not displayed]

FINDINGS: Cardiovascular: There are no filling defects within the pulmonary
arteries to suggest pulmonary embolus. Thoracic aorta is normal in
caliber. No aortic dissection. Heart is normal in size. There is no
pericardial effusion.

Mediastinum/Nodes: No enlarged mediastinal or hilar lymph nodes.
Triangular soft tissue density in the anterior mediastinum is most
consistent with residual or recurrent thymus. Few symmetric
bilateral prominent axillary nodes, likely reactive. Decompressed
esophagus. No visualized thyroid nodule.

Lungs/Pleura: Lungs are clear. No focal airspace disease. No pleural
fluid. No pulmonary edema. The trachea and mainstem bronchi are
patent.

Upper Abdomen: No acute abnormality.

Musculoskeletal: Negative. There are no acute or suspicious osseous
abnormalities. Bilateral nipple rings noted.

Review of the MIP images confirms the above findings.
IMPRESSION: No pulmonary embolus or acute intrathoracic abnormality.

## 2021-08-20 ENCOUNTER — Other Ambulatory Visit (HOSPITAL_BASED_OUTPATIENT_CLINIC_OR_DEPARTMENT_OTHER): Payer: Self-pay | Admitting: Orthopaedic Surgery

## 2021-08-20 DIAGNOSIS — M25562 Pain in left knee: Secondary | ICD-10-CM

## 2021-08-21 ENCOUNTER — Ambulatory Visit (HOSPITAL_BASED_OUTPATIENT_CLINIC_OR_DEPARTMENT_OTHER)
Admission: RE | Admit: 2021-08-21 | Discharge: 2021-08-21 | Disposition: A | Discharge status: Home / Self Care | Payer: 59 | Source: Ambulatory Visit | Attending: Orthopaedic Surgery | Admitting: Orthopaedic Surgery

## 2021-08-21 ENCOUNTER — Ambulatory Visit (INDEPENDENT_AMBULATORY_CARE_PROVIDER_SITE_OTHER): Payer: 59 | Admitting: Orthopaedic Surgery

## 2021-08-21 ENCOUNTER — Other Ambulatory Visit: Payer: Self-pay

## 2021-08-21 ENCOUNTER — Other Ambulatory Visit (HOSPITAL_BASED_OUTPATIENT_CLINIC_OR_DEPARTMENT_OTHER): Payer: Self-pay

## 2021-08-21 DIAGNOSIS — E8889 Other specified metabolic disorders: Secondary | ICD-10-CM | POA: Diagnosis not present

## 2021-08-21 DIAGNOSIS — M25562 Pain in left knee: Secondary | ICD-10-CM | POA: Insufficient documentation

## 2021-08-21 MED ORDER — DICLOFENAC SODIUM 1 % EX GEL
2.0000 g | Freq: Two times a day (BID) | CUTANEOUS | 0 refills | Status: DC
  Filled 2021-08-21: qty 100, 14d supply, fill #0

## 2021-08-21 MED ORDER — METHYLPREDNISOLONE 4 MG PO TBPK
ORAL_TABLET | ORAL | 0 refills | Status: DC
  Filled 2021-08-21: qty 21, 6d supply, fill #0

## 2021-08-21 NOTE — Progress Notes (Signed)
? ?                            ? ? ?Chief Complaint: Left knee pain ?  ? ? ?History of Present Illness:  ? ? ?Julie Lozano is a 36 y.o. female presents with ongoing left knee pain for several months.  She did have an injury last year where she landed directly on the knee.  She states that the knee felt fine until approximately January of this year where she began additional running.  She does work at the [employer] and goes up and down stairs significantly.  She states that she is experiencing pain in the front of the knee around the patella tendon.  She has not had any previous treatments for it she is taking anti-inflammatories. ? ? ? ?Surgical History:   ?None ? ?PMH/PSH/Family History/Social History/Meds/Allergies:   ? ?Past Medical History:  ?Diagnosis Date  ? Allergy   ? Anemia   ? Asthma   ? Sickle cell trait (HCC)   ? ?Past Surgical History:  ?Procedure Laterality Date  ? WISDOM TOOTH EXTRACTION    ? ?Social History  ? ?Socioeconomic History  ? Marital status: Married  ?  Spouse name: Not on file  ? Number of children: Not on file  ? Years of education: Not on file  ? Highest education level: Not on file  ?Occupational History  ? Not on file  ?Tobacco Use  ? Smoking status: Never  ? Smokeless tobacco: Never  ?Substance and Sexual Activity  ? Alcohol use: Yes  ?  Alcohol/week: 6.0 standard drinks  ?  Types: 6 Glasses of wine per week  ? Drug use: No  ? Sexual activity: Not on file  ?Other Topics Concern  ? Not on file  ?Social History Narrative  ? 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 Not married   ? Has no biological kids   ? ?Social Determinants of Health  ? ?Financial Resource Strain: Not on file  ?Food Insecurity: Not on file  ?Transportation Needs: Not on file  ?Physical Activity: Not on file  ?Stress: Not on file  ?Social Connections: Not on file  ? ?Family History  ?Problem Relation Age of Onset  ? Alcohol abuse Father   ? Heart attack Father   ? Stroke Father   ? Hypertension Unknown    ? Hyperlipidemia Unknown   ? Diabetes Maternal Uncle   ? Colon cancer Maternal Grandfather   ? High blood pressure Maternal Grandfather   ? Diabetes Maternal Grandfather   ? ?Allergies  ?Allergen Reactions  ? Mold Extract [Trichophyton] Shortness Of Breath  ? Shellfish Allergy Anaphylaxis  ? Bee Venom Swelling  ? Dust Mite Extract Other (See Comments)  ?  Seasonal allergies   ? Red Dye Diarrhea, Itching and Nausea Only  ? Tree Extract Other (See Comments)  ?  unknown  ? ?Current Outpatient Medications  ?Medication Sig Dispense Refill  ? diclofenac Sodium (VOLTAREN) 1 % GEL Apply 2 g topically in the morning and at bedtime. 50 g 0  ? methylPREDNISolone (MEDROL DOSEPAK) 4 MG TBPK tablet Take per packet instructions 1 each 0  ? acetaminophen (TYLENOL) 325 MG tablet Take 2 tablets (650 mg total) by mouth every 6 (six) hours as needed for mild pain (or Fever >/= 101).    ? albuterol (PROVENTIL HFA;VENTOLIN HFA) 108 (90 Base) MCG/ACT inhaler Inhale 2 puffs into the lungs every 4 (  four) hours as needed for wheezing. 6.7 g 0  ? ELDERBERRY PO Take 1 tablet by mouth daily.    ? EPINEPHrine 0.3 mg/0.3 mL IJ SOAJ injection Inject 0.3 mg into the muscle daily as needed for anaphylaxis.    ? ibuprofen (ADVIL) 800 MG tablet Take 1 tablet (800 mg total) by mouth 3 (three) times daily. 21 tablet 0  ? Multiple Vitamin (MULTIVITAMIN WITH MINERALS) TABS tablet Take 1 tablet by mouth daily.    ? silver sulfADIAZINE (SILVADENE) 1 % cream Apply 1 application topically daily. 50 g 0  ? ?No current facility-administered medications for this visit.  ? ?No results found. ? ?Review of Systems:   ?A ROS was performed including pertinent positives and negatives as documented in the HPI. ? ?Physical Exam :   ?Constitutional: NAD and appears stated age ?Neurological: Alert and oriented ?Psych: Appropriate affect and cooperative ?There were no vitals taken for this visit.  ? ?Comprehensive Musculoskeletal Exam:   ? ?  ?Musculoskeletal Exam  ?Gait  Normal  ?Alignment Normal  ? Right Left  ?Inspection Normal Normal  ?Palpation    ?Tenderness None Hoffa fat pad  ?Crepitus None None  ?Effusion None None  ?Range of Motion    ?Extension 0 0  ?Flexion 135 135  ?Strength    ?Extension 5/5 5/5  ?Flexion 5/5 5/5  ?Ligament Exam     ?Generalized Laxity No No  ?Lachman Negative Negative   ?Pivot Shift Negative Negative  ?Anterior Drawer Negative Negative  ?Valgus at 0 Negative Negative  ?Valgus at 20 Negative Negative  ?Varus at 0 0 0  ?Varus at 20   0 0  ?Posterior Drawer at 90 0 0  ?Vascular/Lymphatic Exam    ?Edema None None  ?Venous Stasis Changes No No  ?Distal Circulation Normal Normal  ?Neurologic    ?Light Touch Sensation Intact Intact  ?Special Tests:   ? ? ? ?Imaging:   ?Xray (4 views left knee): ?Normal ? ? ?I personally reviewed and interpreted the radiographs. ? ? ?Assessment:   ?36 year old female with left Hoffa fat pad irritation and swelling.  I discussed options with her at today's visit.  Ultimately I have recommended an ultrasound-guided injection of the knee.  That being said she has been somewhat hesitant to do this as she previously had had an injection for Morton's neuroma I left her with hypopigmentation.  As result we will plan to defer this.  I have ordered her a Medrol steroid Dosepak as well as Voltaren gel to apply directly to the knee.  She will use this for the next 2 weeks.   ? ?Plan :   ? ?-Return to clinic in 4 weeks ? ? ? ? ?I personally saw and evaluated the patient, and participated in the management and treatment plan. ? ?Huel Cote, MD ?Attending Physician, Orthopedic Surgery ? ?This document was dictated using Conservation officer, historic buildings. A reasonable attempt at proof reading has been made to minimize errors. ?

## 2021-08-27 ENCOUNTER — Encounter (HOSPITAL_BASED_OUTPATIENT_CLINIC_OR_DEPARTMENT_OTHER): Payer: Self-pay | Admitting: Medical

## 2021-08-27 ENCOUNTER — Ambulatory Visit (INDEPENDENT_AMBULATORY_CARE_PROVIDER_SITE_OTHER): Payer: 59 | Admitting: Medical

## 2021-08-27 VITALS — BP 101/71 | HR 78 | Ht 63.0 in | Wt 148.0 lb

## 2021-08-27 DIAGNOSIS — N912 Amenorrhea, unspecified: Secondary | ICD-10-CM

## 2021-08-27 LAB — POCT URINE PREGNANCY: Preg Test, Ur: NEGATIVE

## 2021-08-27 NOTE — Progress Notes (Signed)
? ?History:  ?Julie Lozano is a 36 y.o. G0P0000 who presents to clinic today for evaluation of secondary amenorrhea. The patient states she has had no bleeding in the last 3 months. She has had multiple negative HPTs in addition to negative UPTs at [facility]. She had intercourse, unprotected in December and protected intercourse in February. She is new to our practice and has been previously seem at [facility]. Per her [facility] chart she was previously diagnosed with PCOS, likely based on her periods and possible [place]. Her last pap smear was normal 07/2020. When she was diagnosed with PCOS she was started on birth control. She had a Skyla, which was very hard to place due to pain and was eventually removed. She then tried the Nuvaring which she also found uncomfortable and OCPs that affected her mood and libido. She has used no birth control since 01/2021. She states some cramping last night, but denies bleeding, pain, abnormal discharge, UTI symptoms or fever today.  ? ?The following portions of the patient's history were reviewed and updated as appropriate: allergies, current medications, family history, past medical history, social history, past surgical history and problem list. ? ?Review of Systems:  ?Review of Systems  ?Constitutional:  Negative for fever and malaise/fatigue.  ?Gastrointestinal:  Negative for abdominal pain, constipation, diarrhea, nausea and vomiting.  ?Genitourinary:  Negative for dysuria, frequency and urgency.  ?     Neg - vaginal bleeding, abnormal discharge  ? ?  ?Objective:  ?Physical Exam ?BP 101/71   Pulse 78   Ht 5\' 3"  (1.6 m)   Wt 148 lb (67.1 kg)   LMP 06/13/2021 (Exact Date)   BMI 26.22 kg/m?  ?Physical Exam ?Constitutional:   ?   General: She is not in acute distress. ?   Appearance: She is normal weight.  ?HENT:  ?   Head: Normocephalic.  ?Cardiovascular:  ?   Rate and Rhythm: Normal rate.  ?Pulmonary:  ?   Effort: Pulmonary effort is normal.  ?Abdominal:  ?    General: Abdomen is flat. There is no distension.  ?   Palpations: There is no mass.  ?   Tenderness: There is no abdominal tenderness. There is no guarding or rebound.  ?Skin: ?   General: Skin is warm and dry.  ?   Findings: No erythema.  ?Neurological:  ?   Mental Status: She is alert and oriented to person, place, and time.  ?Psychiatric:     ?   Mood and Affect: Mood normal.  ? ? ?Health Maintenance Due  ?Topic Date Due  ? COVID-19 Vaccine (1) Never done  ? Hepatitis C Screening  Never done  ? INFLUENZA VACCINE  12/29/2020  ? ? ? ?Assessment & Plan:  ?1. Amenorrhea ?- Discussed birth control options, patient is considering a low dose OCP or IUD, however does not want to start today.  ?- POCT urine pregnancy - negative  ?- HgB A1c ?- TSH ?- Prolactin ?- FSH ?- Beta hCG quant (ref lab) ?- Testosterone ?- Testosterone, free ?- Testosterone, % free ?- Sex hormone binding globulin ?- Patient will be contacted with results and in the meantime consider her birth control options.  ?- Patient was advised of the increased risk of certain cancers when periods are skipped.  ? ? ?Approximately 33 minutes of total time was spent with this patient on history taking, chart review, patient education coordination of care, physical exam and documentation.  ? ?Luvenia Redden, PA-C ?08/27/2021 ?4:30 PM ? ? ? ?

## 2021-09-02 ENCOUNTER — Telehealth (HOSPITAL_BASED_OUTPATIENT_CLINIC_OR_DEPARTMENT_OTHER): Payer: Self-pay | Admitting: *Deleted

## 2021-09-02 NOTE — Telephone Encounter (Signed)
Patient call would like a call to go over her results . ?

## 2021-09-02 NOTE — Telephone Encounter (Signed)
Pt requesting lab results. Advised that results were not back and I would check with [facility] on status.  ?

## 2021-09-03 LAB — TSH: TSH: 1.35 u[IU]/mL (ref 0.450–4.500)

## 2021-09-03 LAB — HEMOGLOBIN A1C
Est. average glucose Bld gHb Est-mCnc: 111 mg/dL
Hgb A1c MFr Bld: 5.5 % (ref 4.8–5.6)

## 2021-09-03 LAB — TESTOSTERONE: Testosterone: 45 ng/dL (ref 8–60)

## 2021-09-03 LAB — PROLACTIN: Prolactin: 10.8 ng/mL (ref 4.8–23.3)

## 2021-09-03 LAB — BETA HCG QUANT (REF LAB): hCG Quant: <1 m[IU]/mL

## 2021-09-03 LAB — FOLLICLE STIMULATING HORMONE: FSH: 5.6 m[IU]/mL

## 2021-09-03 LAB — SEX HORMONE BINDING GLOBULIN: Sex Hormone Binding: 43.2 nmol/L (ref 24.6–122.0)

## 2021-09-03 LAB — TESTOSTERONE, FREE: Testosterone, Free: 1.5 pg/mL (ref 0.0–4.2)

## 2021-09-03 LAB — TESTOSTERONE, % FREE: Testosterone-% Free: 1.4 %

## 2021-09-11 ENCOUNTER — Encounter (HOSPITAL_BASED_OUTPATIENT_CLINIC_OR_DEPARTMENT_OTHER): Payer: Self-pay | Admitting: Orthopaedic Surgery

## 2021-09-17 ENCOUNTER — Encounter (HOSPITAL_BASED_OUTPATIENT_CLINIC_OR_DEPARTMENT_OTHER): Payer: Self-pay

## 2021-09-18 ENCOUNTER — Ambulatory Visit (HOSPITAL_BASED_OUTPATIENT_CLINIC_OR_DEPARTMENT_OTHER): Payer: 59 | Admitting: Orthopaedic Surgery

## 2021-09-18 ENCOUNTER — Ambulatory Visit (INDEPENDENT_AMBULATORY_CARE_PROVIDER_SITE_OTHER): Payer: 59 | Admitting: Orthopaedic Surgery

## 2021-09-18 DIAGNOSIS — E8889 Other specified metabolic disorders: Secondary | ICD-10-CM

## 2021-09-18 DIAGNOSIS — M25562 Pain in left knee: Secondary | ICD-10-CM

## 2021-09-18 NOTE — Progress Notes (Signed)
? ?                            ? ? ?Chief Complaint: Left knee pain ?  ? ? ?History of Present Illness:  ? ?09/18/2021: Presents today for follow-up of the left knee.  She did get some relief from the Medrol Dosepak although she has had her pain recur.  She is taking a new job at [employer] is quite anxious and having knee pain.  She is here today for an injection for her fat pad. ? ? ?Julie Lozano is a 36 y.o. female presents with ongoing left knee pain for several months.  She did have an injury last year where she landed directly on the knee.  She states that the knee felt fine until approximately January of this year where she began additional running.  She does work at the [employer] and goes up and down stairs significantly.  She states that she is experiencing pain in the front of the knee around the patella tendon.  She has not had any previous treatments for it she is taking anti-inflammatories. ? ? ? ?Surgical History:   ?None ? ?PMH/PSH/Family History/Social History/Meds/Allergies:   ? ?Past Medical History:  ?Diagnosis Date  ?? Allergy   ?? Anemia   ?? Asthma   ?? Sickle cell trait (HCC)   ? ?Past Surgical History:  ?Procedure Laterality Date  ?? WISDOM TOOTH EXTRACTION    ? ?Social History  ? ?Socioeconomic History  ?? Marital status: Divorced  ?  Spouse name: Not on file  ?? Number of children: Not on file  ?? Years of education: Not on file  ?? Highest education level: Not on file  ?Occupational History  ?? Not on file  ?Tobacco Use  ?? Smoking status: Never  ?? Smokeless tobacco: Never  ?Vaping Use  ?? Vaping Use: Never used  ?Substance and Sexual Activity  ?? Alcohol use: Yes  ?  Alcohol/week: 6.0 standard drinks  ?  Types: 6 Glasses of wine per week  ?? Drug use: No  ?? Sexual activity: Yes  ?  Birth control/protection: None  ?Other Topics Concern  ?? Not on file  ?Social History Narrative  ? 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 Not married   ? Has no biological kids    ? ?Social Determinants of Health  ? ?Financial Resource Strain: Not on file  ?Food Insecurity: Not on file  ?Transportation Needs: Not on file  ?Physical Activity: Not on file  ?Stress: Not on file  ?Social Connections: Not on file  ? ?Family History  ?Problem Relation Age of Onset  ?? Cancer Paternal Grandmother   ?? Stroke Maternal Grandmother   ?? Asthma Maternal Grandmother   ?? Colon cancer Maternal Grandfather   ?? High blood pressure Maternal Grandfather   ?? Diabetes Maternal Grandfather   ?? Heart attack Father   ?? Stroke Father   ?? Drug abuse Father   ?? Obesity Brother   ?? Obesity Sister   ?? Diabetes Maternal Uncle   ?? Hypertension Other   ?? Hyperlipidemia Other   ? ?Allergies  ?Allergen Reactions  ?? Mold Extract [Trichophyton] Shortness Of Breath  ?? Shellfish Allergy Anaphylaxis  ?? Bee Venom Swelling  ?? Dust Mite Extract Other (See Comments)  ?  Seasonal allergies   ?? Red Dye Diarrhea, Itching and Nausea Only  ?? Tree Extract Other (See Comments)  ?  unknown  ? ?Current Outpatient Medications  ?Medication Sig Dispense Refill  ?? acetaminophen (TYLENOL) 325 MG tablet Take 2 tablets (650 mg total) by mouth every 6 (six) hours as needed for mild pain (or Fever >/= 101).    ?? albuterol (PROVENTIL HFA;VENTOLIN HFA) 108 (90 Base) MCG/ACT inhaler Inhale 2 puffs into the lungs every 4 (four) hours as needed for wheezing. 6.7 g 0  ?? diclofenac Sodium (VOLTAREN) 1 % GEL Apply 2 g topically in the morning and at bedtime. 100 g 0  ?? EPINEPHrine 0.3 mg/0.3 mL IJ SOAJ injection Inject 0.3 mg into the muscle daily as needed for anaphylaxis.    ?? methylPREDNISolone (MEDROL DOSEPAK) 4 MG TBPK tablet Take per packet instructions 21 tablet 0  ?? Multiple Vitamin (MULTIVITAMIN WITH MINERALS) TABS tablet Take 1 tablet by mouth daily.    ? ?No current facility-administered medications for this visit.  ? ?No results found. ? ?Review of Systems:   ?A ROS was performed including pertinent positives and negatives  as documented in the HPI. ? ?Physical Exam :   ?Constitutional: NAD and appears stated age ?Neurological: Alert and oriented ?Psych: Appropriate affect and cooperative ?There were no vitals taken for this visit.  ? ?Comprehensive Musculoskeletal Exam:   ? ?  ?Musculoskeletal Exam  ?Gait Normal  ?Alignment Normal  ? Right Left  ?Inspection Normal Normal  ?Palpation    ?Tenderness None Hoffa fat pad  ?Crepitus None None  ?Effusion None None  ?Range of Motion    ?Extension 0 0  ?Flexion 135 135  ?Strength    ?Extension 5/5 5/5  ?Flexion 5/5 5/5  ?Ligament Exam     ?Generalized Laxity No No  ?Lachman Negative Negative   ?Pivot Shift Negative Negative  ?Anterior Drawer Negative Negative  ?Valgus at 0 Negative Negative  ?Valgus at 20 Negative Negative  ?Varus at 0 0 0  ?Varus at 20   0 0  ?Posterior Drawer at 90 0 0  ?Vascular/Lymphatic Exam    ?Edema None None  ?Venous Stasis Changes No No  ?Distal Circulation Normal Normal  ?Neurologic    ?Light Touch Sensation Intact Intact  ?Special Tests:   ? ? ? ?Imaging:   ?Xray (4 views left knee): ?Normal ? ? ?I personally reviewed and interpreted the radiographs. ? ? ?Assessment:   ?36 year old female with left Hoffa fat pad irritation and swelling.  At today's visit I recommended ultrasound-guided fat pad injection which we will plan to perform today.  I will see her back in 8 weeks for follow-up ? ?Plan :   ? ?-Return to clinic in 8 weeks ? ? ? ?Procedure Note ? ?Patient: Julie Lozano             ?Date of Birth: Feb 10, 1986           ?MRN: [id number]             ?Visit Date: 09/18/2021 ? ?Procedures: ?Visit Diagnoses: No diagnosis found. ? ?Large Joint Inj on 09/18/2021 8:40 AM ?Indications: pain ?Details: 22 G 1.5 in needle, ultrasound-guided anterior approach ? ?Arthrogram: No ? ?Outcome: tolerated well, no immediate complications ?Procedure, treatment alternatives, risks and benefits explained, specific risks discussed. Consent was given by the patient. Immediately prior  to procedure a time out was called to verify the correct patient, procedure, equipment, support staff and site/side marked as required. Patient was prepped and draped in the usual sterile fashion.  ? ? ? ? ? ? ? ? ?I personally saw and  evaluated the patient, and participated in the management and treatment plan. ? ?Huel Cote, MD ?Attending Physician, Orthopedic Surgery ? ?This document was dictated using Conservation officer, historic buildings. A reasonable attempt at proof reading has been made to minimize errors. ?

## 2021-11-18 ENCOUNTER — Ambulatory Visit (HOSPITAL_BASED_OUTPATIENT_CLINIC_OR_DEPARTMENT_OTHER): Payer: 59 | Admitting: Orthopaedic Surgery

## 2022-02-02 ENCOUNTER — Ambulatory Visit
Admission: EM | Admit: 2022-02-02 | Discharge: 2022-02-02 | Disposition: A | Discharge status: Home / Self Care | Payer: 59 | Attending: Internal Medicine | Admitting: Internal Medicine

## 2022-02-02 DIAGNOSIS — R21 Rash and other nonspecific skin eruption: Secondary | ICD-10-CM

## 2022-02-02 MED ORDER — PREDNISONE 20 MG PO TABS: 40.0000 mg | ORAL_TABLET | Freq: Every day | ORAL | 0 refills | Status: AC

## 2022-02-02 NOTE — Discharge Instructions (Signed)
It appears that you may be having an allergic reaction so prednisone has been prescribed for you to take.  Please follow-up if symptoms persist or worsen.

## 2022-02-02 NOTE — ED Provider Notes (Signed)
[facility]    CSN: [id number] Arrival date & time: 02/02/22  6295      History   Chief Complaint Chief Complaint  Patient presents with   Allergic Reaction    HPI Julie Lozano is a 36 y.o. female.   Patient presents with itchy rash to right forehead, back, right posterior knee, posterior neck that has been present since yesterday.  Patient has applied bleach with no improvement.  Denies any changes in environment including lotions, soaps, detergents, foods, etc.  Denies any associated fever.  Denies any feelings of throat closing or shortness of breath.   Allergic Reaction   Past Medical History:  Diagnosis Date   Allergy    Anemia    Asthma    Sickle cell trait [facility])     Patient Active Problem List   Diagnosis Date Noted   Fever 05/09/2019   Pain in left foot 12/21/2014   Neck pain 08/30/2012   Eczema 08/30/2012   ANEMIA-NOS 09/08/2008   Allergic rhinitis 09/08/2008   Asthma 08/29/2008    Past Surgical History:  Procedure Laterality Date   WISDOM TOOTH EXTRACTION      OB History     Gravida  0   Para  0   Term  0   Preterm  0   AB  0   Living  0      SAB  0   IAB  0   Ectopic  0   Multiple  0   Live Births  0            Home Medications    Prior to Admission medications   Medication Sig Start Date End Date Taking? Authorizing Provider  predniSONE (DELTASONE) 20 MG tablet Take 2 tablets (40 mg total) by mouth daily for 5 days. 02/02/22 02/07/22 Yes Esperansa Sarabia, Acie Fredrickson, FNP  acetaminophen (TYLENOL) 325 MG tablet Take 2 tablets (650 mg total) by mouth every 6 (six) hours as needed for mild pain (or Fever >/= 101). 05/09/19   Zannie Cove, MD  albuterol (PROVENTIL HFA;VENTOLIN HFA) 108 (90 Base) MCG/ACT inhaler Inhale 2 puffs into the lungs every 4 (four) hours as needed for wheezing. 08/13/16   Nafziger, Kandee Keen, NP  diclofenac Sodium (VOLTAREN) 1 % GEL Apply 2 g topically in the morning and at bedtime. 08/21/21   Huel Cote, MD  EPINEPHrine 0.3 mg/0.3 mL IJ SOAJ injection Inject 0.3 mg into the muscle daily as needed for anaphylaxis.    [provider]  Multiple Vitamin (MULTIVITAMIN WITH MINERALS) TABS tablet Take 1 tablet by mouth daily.    [provider]    Family History Family History  Problem Relation Age of Onset   Cancer Paternal Grandmother    Stroke Maternal Grandmother    Asthma Maternal Grandmother    Colon cancer Maternal Grandfather    High blood pressure Maternal Grandfather    Diabetes Maternal Grandfather    Heart attack Father    Stroke Father    Drug abuse Father    Obesity Brother    Obesity Sister    Diabetes Maternal Uncle    Hypertension Other    Hyperlipidemia Other     Social History Social History   Tobacco Use   Smoking status: Never   Smokeless tobacco: Never  Vaping Use   Vaping Use: Never used  Substance Use Topics   Alcohol use: Yes    Alcohol/week: 6.0 standard drinks of alcohol    Types: 6 Glasses  of wine per week   Drug use: No     Allergies   Latex, Mold extract [trichophyton], Shellfish allergy, Bee venom, Dust mite extract, Red dye, and Tree extract   Review of Systems Review of Systems Per HPI  Physical Exam Triage Vital Signs ED Triage Vitals  Enc Vitals Group     BP 02/02/22 0900 102/72     Pulse Rate 02/02/22 0900 83     Resp 02/02/22 0900 16     Temp 02/02/22 0904 97.6 F (36.4 C)     Temp src --      SpO2 02/02/22 0900 99 %     Weight --      Height --      Head Circumference --      Peak Flow --      Pain Score 02/02/22 0901 4     Pain Loc --      Pain Edu? --      Excl. in GC? --    No data found.  Updated Vital Signs BP 102/72   Pulse 83   Temp 97.6 F (36.4 C)   Resp 16   LMP  (LMP Unknown)   SpO2 99%   Visual Acuity Right Eye Distance:   Left Eye Distance:   Bilateral Distance:    Right Eye Near:   Left Eye Near:    Bilateral Near:     Physical Exam Constitutional:       General: She is not in acute distress.    Appearance: Normal appearance. She is not toxic-appearing or diaphoretic.  HENT:     Head: Normocephalic and atraumatic.  Eyes:     Extraocular Movements: Extraocular movements intact.     Conjunctiva/sclera: Conjunctivae normal.  Pulmonary:     Effort: Pulmonary effort is normal.  Skin:    Comments: Papular/vesicular rash present to right forehead, right posterior knee, various areas of back, left posterior neck.  Neurological:     General: No focal deficit present.     Mental Status: She is alert and oriented to person, place, and time. Mental status is at baseline.  Psychiatric:        Mood and Affect: Mood normal.        Behavior: Behavior normal.        Thought Content: Thought content normal.        Judgment: Judgment normal.      UC Treatments / Results  Labs (all labs ordered are listed, but only abnormal results are displayed) Labs Reviewed - No data to display  EKG   Radiology No results found.  Procedures Procedures (including critical care time)  Medications Ordered in UC Medications - No data to display  Initial Impression / Assessment and Plan / UC Course  I have reviewed the triage vital signs and the nursing notes.  Pertinent labs & imaging results that were available during my care of the patient were reviewed by me and considered in my medical decision making (see chart for details).     Rash appears consistent with possible irritant versus allergic contact dermatitis.  No signs of bacterial, viral, fungal infection on exam.  Will treat with prednisone given facial involvement.  Patient has taken steroids previously and has tolerated well.  Advised to follow-up if symptoms persist or worsen.  Patient verbalized understanding and was agreeable with plan. Final Clinical Impressions(s) / UC Diagnoses   Final diagnoses:  Rash and nonspecific skin eruption     Discharge Instructions  It appears that  you may be having an allergic reaction so prednisone has been prescribed for you to take.  Please follow-up if symptoms persist or worsen.    ED Prescriptions     Medication Sig Dispense Auth. Provider   predniSONE (DELTASONE) 20 MG tablet Take 2 tablets (40 mg total) by mouth daily for 5 days. 10 tablet Gustavus Bryant, [place]      PDMP not reviewed this encounter.   Gustavus Bryant, [place] 02/02/22 [phone]

## 2022-02-02 NOTE — ED Triage Notes (Signed)
Pt. States she has a rash that started yesterday randomly. Pt. States the rash appears on her forehead, neck and back of right knee. Pt. States she put bleach on the rashes and it did not help.

## 2022-02-07 ENCOUNTER — Encounter (HOSPITAL_COMMUNITY): Payer: Self-pay

## 2022-02-07 ENCOUNTER — Ambulatory Visit (HOSPITAL_COMMUNITY)
Admission: EM | Admit: 2022-02-07 | Discharge: 2022-02-07 | Disposition: A | Discharge status: Home / Self Care | Payer: Medicaid Other | Attending: Physician Assistant | Admitting: Physician Assistant

## 2022-02-07 ENCOUNTER — Telehealth (HOSPITAL_COMMUNITY): Payer: Self-pay

## 2022-02-07 DIAGNOSIS — R21 Rash and other nonspecific skin eruption: Secondary | ICD-10-CM

## 2022-02-07 MED ORDER — KETOCONAZOLE 2 % EX CREA: 1.0000 | TOPICAL_CREAM | Freq: Every day | CUTANEOUS | 0 refills | Status: DC

## 2022-02-07 MED ORDER — HYDROXYZINE HCL 25 MG PO TABS: 25.0000 mg | ORAL_TABLET | Freq: Four times a day (QID) | ORAL | 0 refills | Status: DC | PRN

## 2022-02-07 NOTE — Discharge Instructions (Signed)
Take hydroxyzine as needed for itching  Apply antifungal cream once per day.  Recommend follow up with dermatology Can return here if no improvement

## 2022-02-07 NOTE — ED Triage Notes (Signed)
Pt presents to the office for rash all over her body that started around labor day. She was seen at [facility] [address] and prescribe prednisone.

## 2022-02-21 NOTE — ED Provider Notes (Signed)
[facility]    CSN: [id number] Arrival date & time: 02/07/22  1713      History   Chief Complaint No chief complaint on file.   HPI Julie Lozano is a 36 y.o. female.   Pt presents for evaluation of persistent rash that started about one month ago.  She reports she was prescribed a course of prednisone that provided no relief.  Reports she feels like it is getting worse at this point. She reports itching all over. Denies new lotions, detergents, creams.     Past Medical History:  Diagnosis Date   Allergy    Anemia    Asthma    Sickle cell trait [facility])     Patient Active Problem List   Diagnosis Date Noted   Fever 05/09/2019   Pain in left foot 12/21/2014   Neck pain 08/30/2012   Eczema 08/30/2012   ANEMIA-NOS 09/08/2008   Allergic rhinitis 09/08/2008   Asthma 08/29/2008    Past Surgical History:  Procedure Laterality Date   WISDOM TOOTH EXTRACTION      OB History     Gravida  0   Para  0   Term  0   Preterm  0   AB  0   Living  0      SAB  0   IAB  0   Ectopic  0   Multiple  0   Live Births  0            Home Medications    Prior to Admission medications   Medication Sig Start Date End Date Taking? Authorizing Provider  acetaminophen (TYLENOL) 325 MG tablet Take 2 tablets (650 mg total) by mouth every 6 (six) hours as needed for mild pain (or Fever >/= 101). 05/09/19   Zannie Cove, MD  albuterol (PROVENTIL HFA;VENTOLIN HFA) 108 (90 Base) MCG/ACT inhaler Inhale 2 puffs into the lungs every 4 (four) hours as needed for wheezing. 08/13/16   Nafziger, Kandee Keen, NP  diclofenac Sodium (VOLTAREN) 1 % GEL Apply 2 g topically in the morning and at bedtime. 08/21/21   Huel Cote, MD  EPINEPHrine 0.3 mg/0.3 mL IJ SOAJ injection Inject 0.3 mg into the muscle daily as needed for anaphylaxis.    [provider]  hydrOXYzine (ATARAX) 25 MG tablet Take 1 tablet (25 mg total) by mouth every 6 (six) hours as needed for  itching. 02/07/22   Ward, Tylene Fantasia, PA-C  ketoconazole (NIZORAL) 2 % cream Apply 1 Application topically daily. 02/07/22   Ward, Tylene Fantasia, PA-C  Multiple Vitamin (MULTIVITAMIN WITH MINERALS) TABS tablet Take 1 tablet by mouth daily.    [provider]    Family History Family History  Problem Relation Age of Onset   Cancer Paternal Grandmother    Stroke Maternal Grandmother    Asthma Maternal Grandmother    Colon cancer Maternal Grandfather    High blood pressure Maternal Grandfather    Diabetes Maternal Grandfather    Heart attack Father    Stroke Father    Drug abuse Father    Obesity Brother    Obesity Sister    Diabetes Maternal Uncle    Hypertension Other    Hyperlipidemia Other     Social History Social History   Tobacco Use   Smoking status: Never   Smokeless tobacco: Never  Vaping Use   Vaping Use: Never used  Substance Use Topics   Alcohol use: Yes    Alcohol/week: 6.0 standard drinks of  alcohol    Types: 6 Glasses of wine per week   Drug use: No     Allergies   Latex, Mold extract [trichophyton], Shellfish allergy, Bee venom, Dust mite extract, Red dye, and Tree extract   Review of Systems Review of Systems  Constitutional:  Negative for chills and fever.  HENT:  Negative for ear pain and sore throat.   Eyes:  Negative for pain and visual disturbance.  Respiratory:  Negative for cough and shortness of breath.   Cardiovascular:  Negative for chest pain and palpitations.  Gastrointestinal:  Negative for abdominal pain and vomiting.  Genitourinary:  Negative for dysuria and hematuria.  Musculoskeletal:  Negative for arthralgias and back pain.  Skin:  Positive for rash. Negative for color change.  Neurological:  Negative for seizures and syncope.  All other systems reviewed and are negative.    Physical Exam Triage Vital Signs ED Triage Vitals  Enc Vitals Group     BP 02/07/22 1739 122/84     Pulse Rate 02/07/22 1739 96     Resp  02/07/22 1739 18     Temp 02/07/22 1739 98.4 F (36.9 C)     Temp Source 02/07/22 1739 Oral     SpO2 02/07/22 1739 99 %     Weight --      Height --      Head Circumference --      Peak Flow --      Pain Score 02/07/22 1802 4     Pain Loc --      Pain Edu? --      Excl. in GC? --    No data found.  Updated Vital Signs BP 122/84 (BP Location: Left Arm)   Pulse 96   Temp 98.4 F (36.9 C) (Oral)   Resp 18   LMP 01/19/2022   SpO2 99%   Visual Acuity Right Eye Distance:   Left Eye Distance:   Bilateral Distance:    Right Eye Near:   Left Eye Near:    Bilateral Near:     Physical Exam Vitals and nursing note reviewed.  Constitutional:      General: She is not in acute distress.    Appearance: She is well-developed.  HENT:     Head: Normocephalic and atraumatic.  Eyes:     Conjunctiva/sclera: Conjunctivae normal.  Cardiovascular:     Rate and Rhythm: Normal rate and regular rhythm.     Heart sounds: No murmur heard. Pulmonary:     Effort: Pulmonary effort is normal. No respiratory distress.     Breath sounds: Normal breath sounds.  Abdominal:     Palpations: Abdomen is soft.     Tenderness: There is no abdominal tenderness.  Musculoskeletal:        General: No swelling.     Cervical back: Neck supple.  Skin:    General: Skin is warm and dry.     Capillary Refill: Capillary refill takes less than 2 seconds.     Comments: Red raised rash to right posterior knee, various areas of back, left posterior neck.   Neurological:     Mental Status: She is alert.  Psychiatric:        Mood and Affect: Mood normal.      UC Treatments / Results  Labs (all labs ordered are listed, but only abnormal results are displayed) Labs Reviewed - No data to display  EKG   Radiology No results found.  Procedures Procedures (including critical care  time)  Medications Ordered in UC Medications - No data to display  Initial Impression / Assessment and Plan / UC Course   I have reviewed the triage vital signs and the nursing notes.  Pertinent labs & imaging results that were available during my care of the patient were reviewed by me and considered in my medical decision making (see chart for details).    Rash, unknown etiology. She reports she feels like the prednisone made the rash worse.  Will trial antifungal cream.  Advised follow up with dermatology.  Final Clinical Impressions(s) / UC Diagnoses   Final diagnoses:  Rash and nonspecific skin eruption     Discharge Instructions      Take hydroxyzine as needed for itching  Apply antifungal cream once per day.  Recommend follow up with dermatology Can return here if no improvement    ED Prescriptions     Medication Sig Dispense Auth. Provider   hydrOXYzine (ATARAX) 25 MG tablet Take 1 tablet (25 mg total) by mouth every 6 (six) hours as needed for itching. 12 tablet Ward, Lenise Arena, PA-C   ketoconazole (NIZORAL) 2 % cream Apply 1 Application topically daily. 15 g Ward, Lenise Arena, PA-C      PDMP not reviewed this encounter.   Ward, Jullia Mulligan, PA-C 02/21/22 1246

## 2022-03-15 DIAGNOSIS — R69 Illness, unspecified: Secondary | ICD-10-CM | POA: Diagnosis not present

## 2022-07-15 ENCOUNTER — Ambulatory Visit
Admission: EM | Admit: 2022-07-15 | Discharge: 2022-07-15 | Disposition: A | Discharge status: Home / Self Care | Payer: Medicaid Other | Attending: Internal Medicine | Admitting: Internal Medicine

## 2022-07-15 DIAGNOSIS — Z1152 Encounter for screening for COVID-19: Secondary | ICD-10-CM | POA: Diagnosis not present

## 2022-07-15 DIAGNOSIS — J069 Acute upper respiratory infection, unspecified: Secondary | ICD-10-CM | POA: Diagnosis not present

## 2022-07-15 DIAGNOSIS — J029 Acute pharyngitis, unspecified: Secondary | ICD-10-CM | POA: Diagnosis not present

## 2022-07-15 DIAGNOSIS — R051 Acute cough: Secondary | ICD-10-CM | POA: Insufficient documentation

## 2022-07-15 LAB — POCT RAPID STREP A (OFFICE): Rapid Strep A Screen: NEGATIVE

## 2022-07-15 MED ORDER — AMOXICILLIN-POT CLAVULANATE 875-125 MG PO TABS: 1.0000 | ORAL_TABLET | Freq: Two times a day (BID) | ORAL | 0 refills | Status: DC

## 2022-07-15 MED ORDER — FLUTICASONE PROPIONATE 50 MCG/ACT NA SUSP: 1.0000 | Freq: Every day | NASAL | 0 refills | Status: DC

## 2022-07-15 NOTE — ED Triage Notes (Signed)
Pt present headache with sore throat, symptom started on Monday. Pt state her mucus is green with blood.

## 2022-07-15 NOTE — Discharge Instructions (Signed)
Strep is negative.  Throat culture and COVID test are pending.  I have prescribed you an antibiotic to treat upper respiratory infection.  Please follow-up if any symptoms persist or worsen.

## 2022-07-15 NOTE — ED Provider Notes (Signed)
[facility]    CSN: [id number] Arrival date & time: 07/15/22  [id number]      History   Chief Complaint Chief Complaint  Patient presents with   Headache    HPI Julie Lozano is a 37 y.o. female.   Patient presents with sore throat, nasal congestion, fatigue that started about 4 days ago.  Patient denies any associated cough, chest pain, shortness of breath, nausea, vomiting, diarrhea, abdominal pain.  Patient reports that her mucus in her nose turned green last night and she has been having some blood-tinged nasal mucus.  Denies any known sick contacts.  Tmax at home was 99.  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  Has taken over-the-counter medications with minimal improvement.   Headache   Past Medical History:  Diagnosis Date   Allergy    Anemia    Asthma    Sickle cell trait [facility])     Patient Active Problem List   Diagnosis Date Noted   Fever 05/09/2019   Pain in left foot 12/21/2014   Neck pain 08/30/2012   Eczema 08/30/2012   ANEMIA-NOS 09/08/2008   Allergic rhinitis 09/08/2008   Asthma 08/29/2008    Past Surgical History:  Procedure Laterality Date   WISDOM TOOTH EXTRACTION      OB History     Gravida  0   Para  0   Term  0   Preterm  0   AB  0   Living  0      SAB  0   IAB  0   Ectopic  0   Multiple  0   Live Births  0            Home Medications    Prior to Admission medications   Medication Sig Start Date End Date Taking? Authorizing Provider  amoxicillin-clavulanate (AUGMENTIN) 875-125 MG tablet Take 1 tablet by mouth every 12 (twelve) hours. 07/15/22  Yes Man Bonneau, Hildred Alamin E, FNP  fluticasone (FLONASE) 50 MCG/ACT nasal spray Place 1 spray into both nostrils daily. 07/15/22  Yes Naomii Kreger, Michele Rockers, FNP  acetaminophen (TYLENOL) 325 MG tablet Take 2 tablets (650 mg total) by mouth every 6 (six) hours as needed for mild pain (or Fever >/= 101). 05/09/19   Domenic Polite, MD  albuterol (PROVENTIL HFA;VENTOLIN  HFA) 108 (90 Base) MCG/ACT inhaler Inhale 2 puffs into the lungs every 4 (four) hours as needed for wheezing. 08/13/16   Nafziger, Tommi Rumps, NP  diclofenac Sodium (VOLTAREN) 1 % GEL Apply 2 g topically in the morning and at bedtime. 08/21/21   Vanetta Mulders, MD  EPINEPHrine 0.3 mg/0.3 mL IJ SOAJ injection Inject 0.3 mg into the muscle daily as needed for anaphylaxis.    [provider]  hydrOXYzine (ATARAX) 25 MG tablet Take 1 tablet (25 mg total) by mouth every 6 (six) hours as needed for itching. 02/07/22   Ward, Lenise Arena, PA-C  ketoconazole (NIZORAL) 2 % cream Apply 1 Application topically daily. 02/07/22   Ward, Lenise Arena, PA-C  Multiple Vitamin (MULTIVITAMIN WITH MINERALS) TABS tablet Take 1 tablet by mouth daily.    [provider]    Family History Family History  Problem Relation Age of Onset   Cancer Paternal Grandmother    Stroke Maternal Grandmother    Asthma Maternal Grandmother    Colon cancer Maternal Grandfather    High blood pressure Maternal Grandfather    Diabetes Maternal Grandfather    Heart attack Father    Stroke Father  Drug abuse Father    Obesity Brother    Obesity Sister    Diabetes Maternal Uncle    Hypertension Other    Hyperlipidemia Other     Social History Social History   Tobacco Use   Smoking status: Never   Smokeless tobacco: Never  Vaping Use   Vaping Use: Never used  Substance Use Topics   Alcohol use: Yes    Alcohol/week: 6.0 standard drinks of alcohol    Types: 6 Glasses of wine per week   Drug use: No     Allergies   Latex, Mold extract [trichophyton], Shellfish allergy, Bee venom, Dust mite extract, Red dye, and Tree extract   Review of Systems Review of Systems Per HPI  Physical Exam Triage Vital Signs ED Triage Vitals  Enc Vitals Group     BP 07/15/22 0909 94/62     Pulse Rate 07/15/22 0909 97     Resp 07/15/22 0909 18     Temp 07/15/22 0909 98.1 F (36.7 C)     Temp Source 07/15/22 0909 Oral      SpO2 07/15/22 0909 98 %     Weight --      Height --      Head Circumference --      Peak Flow --      Pain Score 07/15/22 0910 6     Pain Loc --      Pain Edu? --      Excl. in [place]? --    No data found.  Updated Vital Signs BP 96/68 (BP Location: Left Arm)   Pulse 97   Temp 98.1 F (36.7 C) (Oral)   Resp 18   LMP 06/30/2022   SpO2 98%   Visual Acuity Right Eye Distance:   Left Eye Distance:   Bilateral Distance:    Right Eye Near:   Left Eye Near:    Bilateral Near:     Physical Exam Constitutional:      General: She is not in acute distress.    Appearance: Normal appearance. She is not toxic-appearing or diaphoretic.  HENT:     Head: Normocephalic and atraumatic.     Right Ear: Tympanic membrane and ear canal normal.     Left Ear: Tympanic membrane and ear canal normal.     Nose: Congestion present.     Right Nostril: No epistaxis.     Left Nostril: No epistaxis.     Comments: Significant nasal congestion noted.    Mouth/Throat:     Mouth: Mucous membranes are moist.     Pharynx: Posterior oropharyngeal erythema present.  Eyes:     Extraocular Movements: Extraocular movements intact.     Conjunctiva/sclera: Conjunctivae normal.     Pupils: Pupils are equal, round, and reactive to light.  Cardiovascular:     Rate and Rhythm: Normal rate and regular rhythm.     Pulses: Normal pulses.     Heart sounds: Normal heart sounds.  Pulmonary:     Effort: Pulmonary effort is normal. No respiratory distress.     Breath sounds: Normal breath sounds. No stridor. No wheezing, rhonchi or rales.  Abdominal:     General: Abdomen is flat. Bowel sounds are normal.     Palpations: Abdomen is soft.  Musculoskeletal:        General: Normal range of motion.     Cervical back: Normal range of motion.  Skin:    General: Skin is warm and dry.  Neurological:  General: No focal deficit present.     Mental Status: She is alert and oriented to person, place, and time. Mental  status is at baseline.  Psychiatric:        Mood and Affect: Mood normal.        Behavior: Behavior normal.      UC Treatments / Results  Labs (all labs ordered are listed, but only abnormal results are displayed) Labs Reviewed  CULTURE, GROUP A STREP ([place])  SARS CORONAVIRUS 2 (TAT 6-24 HRS)  POCT RAPID STREP A (OFFICE)    EKG   Radiology No results found.  Procedures Procedures (including critical care time)  Medications Ordered in UC Medications - No data to display  Initial Impression / Assessment and Plan / UC Course  I have reviewed the triage vital signs and the nursing notes.  Pertinent labs & imaging results that were available during my care of the patient were reviewed by me and considered in my medical decision making (see chart for details).     I am most suspicious that patient has a viral upper respiratory infection but given change in mucus color and significant nasal congestion and sinus pressure noted on exam, will opt to treat with Augmentin antibiotic for secondary bacterial infection.  Called pharmacy to confirm that Augmentin does not have red dye.  Rapid strep is negative.  Throat culture and COVID test pending.  Advised supportive care and symptom management.  Discussed return precautions.  Patient verbalized understanding and was agreeable with plan. Final Clinical Impressions(s) / UC Diagnoses   Final diagnoses:  Acute upper respiratory infection  Sore throat  Acute cough     Discharge Instructions      Strep is negative.  Throat culture and COVID test are pending.  I have prescribed you an antibiotic to treat upper respiratory infection.  Please follow-up if any symptoms persist or worsen.    ED Prescriptions     Medication Sig Dispense Auth. Provider   amoxicillin-clavulanate (AUGMENTIN) 875-125 MG tablet Take 1 tablet by mouth every 12 (twelve) hours. 14 tablet [place], Clayton E, [place]   fluticasone [facility]) 50 MCG/ACT nasal spray  Place 1 spray into both nostrils daily. 16 g Teodora Medici, [place]      PDMP not reviewed this encounter.   Teodora Medici, [place] 07/15/22 1013

## 2022-07-16 LAB — SARS CORONAVIRUS 2 (TAT 6-24 HRS): SARS Coronavirus 2: NEGATIVE

## 2022-07-18 LAB — CULTURE, GROUP A STREP (THRC)

## 2022-08-03 ENCOUNTER — Encounter (HOSPITAL_BASED_OUTPATIENT_CLINIC_OR_DEPARTMENT_OTHER): Payer: 59

## 2022-08-03 DIAGNOSIS — R3915 Urgency of urination: Secondary | ICD-10-CM | POA: Diagnosis not present

## 2022-08-03 DIAGNOSIS — K649 Unspecified hemorrhoids: Secondary | ICD-10-CM | POA: Diagnosis not present

## 2022-08-03 DIAGNOSIS — L309 Dermatitis, unspecified: Secondary | ICD-10-CM | POA: Diagnosis not present

## 2022-08-03 DIAGNOSIS — Z0000 Encounter for general adult medical examination without abnormal findings: Secondary | ICD-10-CM | POA: Diagnosis not present

## 2022-11-24 DIAGNOSIS — Z5986 Financial insecurity: Secondary | ICD-10-CM | POA: Diagnosis not present

## 2022-11-24 DIAGNOSIS — Z5919 Other inadequate housing: Secondary | ICD-10-CM | POA: Diagnosis not present

## 2022-11-24 DIAGNOSIS — Z823 Family history of stroke: Secondary | ICD-10-CM | POA: Diagnosis not present

## 2022-11-24 DIAGNOSIS — G8929 Other chronic pain: Secondary | ICD-10-CM | POA: Diagnosis not present

## 2022-11-24 DIAGNOSIS — Z8249 Family history of ischemic heart disease and other diseases of the circulatory system: Secondary | ICD-10-CM | POA: Diagnosis not present

## 2022-11-24 DIAGNOSIS — Z91040 Latex allergy status: Secondary | ICD-10-CM | POA: Diagnosis not present

## 2022-11-24 DIAGNOSIS — Z5948 Other specified lack of adequate food: Secondary | ICD-10-CM | POA: Diagnosis not present

## 2022-12-03 ENCOUNTER — Telehealth: Payer: Self-pay

## 2022-12-03 ENCOUNTER — Ambulatory Visit: Payer: 59 | Admitting: Family Medicine

## 2022-12-03 NOTE — Telephone Encounter (Signed)
NS 7/5 no reason lettersent pt blocked.

## 2022-12-05 ENCOUNTER — Other Ambulatory Visit: Payer: Self-pay

## 2022-12-05 ENCOUNTER — Emergency Department (HOSPITAL_COMMUNITY)
Admission: EM | Admit: 2022-12-05 | Discharge: 2022-12-05 | Disposition: A | Discharge status: Home / Self Care | Payer: 59 | Attending: Emergency Medicine | Admitting: Emergency Medicine

## 2022-12-05 ENCOUNTER — Emergency Department (HOSPITAL_COMMUNITY): Payer: 59

## 2022-12-05 DIAGNOSIS — M62838 Other muscle spasm: Secondary | ICD-10-CM | POA: Diagnosis not present

## 2022-12-05 DIAGNOSIS — M542 Cervicalgia: Secondary | ICD-10-CM

## 2022-12-05 MED ORDER — LIDOCAINE 5 % EX PTCH: 1.0000 | MEDICATED_PATCH | CUTANEOUS | 0 refills | Status: AC

## 2022-12-05 MED ORDER — CYCLOBENZAPRINE HCL 10 MG PO TABS: 10.0000 mg | ORAL_TABLET | Freq: Three times a day (TID) | ORAL | 0 refills | Status: DC | PRN
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
## 2022-12-05 NOTE — ED Triage Notes (Signed)
Patient reports posterior neck pain injured yesterday when she accidentally hit her head against the car's door frame , pain increases with movement .

## 2022-12-08 DIAGNOSIS — Z113 Encounter for screening for infections with a predominantly sexual mode of transmission: Secondary | ICD-10-CM | POA: Diagnosis not present

## 2022-12-08 DIAGNOSIS — N898 Other specified noninflammatory disorders of vagina: Secondary | ICD-10-CM | POA: Diagnosis not present

## 2022-12-08 DIAGNOSIS — Z114 Encounter for screening for human immunodeficiency virus [HIV]: Secondary | ICD-10-CM | POA: Diagnosis not present

## 2022-12-25 ENCOUNTER — Encounter (HOSPITAL_COMMUNITY): Payer: Self-pay

## 2022-12-25 ENCOUNTER — Ambulatory Visit (HOSPITAL_COMMUNITY)
Admission: EM | Admit: 2022-12-25 | Discharge: 2022-12-25 | Disposition: A | Discharge status: Home / Self Care | Payer: 59 | Attending: Emergency Medicine | Admitting: Emergency Medicine

## 2022-12-25 DIAGNOSIS — M791 Myalgia, unspecified site: Secondary | ICD-10-CM | POA: Diagnosis not present

## 2022-12-25 MED ORDER — IBUPROFEN 800 MG PO TABS: 800.0000 mg | ORAL_TABLET | Freq: Three times a day (TID) | ORAL | 0 refills | Status: AC

## 2022-12-25 MED ORDER — CYCLOBENZAPRINE HCL 10 MG PO TABS: 10.0000 mg | ORAL_TABLET | Freq: Three times a day (TID) | ORAL | 0 refills | Status: AC | PRN

## 2022-12-25 MED ORDER — KETOROLAC TROMETHAMINE 30 MG/ML IJ SOLN
30.0000 mg | Freq: Once | INTRAMUSCULAR | Status: AC
  Administered 2022-12-25: 30 mg via INTRAMUSCULAR

## 2022-12-25 MED ORDER — KETOROLAC TROMETHAMINE 30 MG/ML IJ SOLN
INTRAMUSCULAR | Status: AC
  Filled 2022-12-25: qty 1
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
## 2022-12-25 NOTE — Discharge Instructions (Addendum)
You can take the muscle relaxer (Flexeril) three times daily. If the medication makes you drowsy, take only one at bed time.  Ibuprofen and/or tylenol every 4-6 hours. Do not use any ibuprofen/Advil or naproxen/Aleve for the rest of today since we have given you the Toradol injection. You can use tylenol today  Use hot pad or ice as needed Avoid heavy lifting and strenuous activity Gentle stretching and movement  It may take several days to a week for symptoms to improve.

## 2023-04-26 ENCOUNTER — Other Ambulatory Visit: Payer: Self-pay | Admitting: Otolaryngology

## 2023-05-06 ENCOUNTER — Other Ambulatory Visit: Payer: Self-pay | Admitting: Otolaryngology

## 2023-05-10 ENCOUNTER — Other Ambulatory Visit: Payer: Self-pay

## 2023-05-10 ENCOUNTER — Encounter (HOSPITAL_COMMUNITY): Payer: Self-pay | Admitting: Otolaryngology

## 2023-05-10 NOTE — Progress Notes (Signed)
SDW CALL  Patient was given pre-op instructions over the phone. The opportunity was given for the patient to ask questions. No further questions asked. Patient verbalized understanding of instructions given.   PCP - currently does not have a PCP Cardiologist - denies  PPM/ICD - denies Device Orders - n/a Rep Notified - n/a  Chest x-ray - denies EKG - denies Stress Test - denies ECHO - denies Cardiac Cath - denies  Sleep Study - denies   Last dose of GLP1 agonist-  n/a GLP1 instructions: n/a  Blood Thinner Instructions: n/a Aspirin Instructions: n/a  ERAS Protcol - clears until 1230   COVID TEST- no      Anesthesia review: no  Patient denies shortness of breath, fever, cough and chest pain over the phone call   All instructions explained to the patient, with a verbal understanding of the material. Patient agrees to go over the instructions while at home for a better understanding.

## 2023-05-12 ENCOUNTER — Ambulatory Visit (HOSPITAL_COMMUNITY): Payer: 59 | Admitting: Anesthesiology

## 2023-05-12 ENCOUNTER — Encounter (HOSPITAL_COMMUNITY): Payer: Self-pay | Admitting: Otolaryngology

## 2023-05-12 ENCOUNTER — Ambulatory Visit (HOSPITAL_COMMUNITY)
Admission: RE | Admit: 2023-05-12 | Discharge: 2023-05-12 | Disposition: A | Discharge status: Home / Self Care | Payer: 59 | Source: Ambulatory Visit | Attending: Otolaryngology | Admitting: Otolaryngology

## 2023-05-12 ENCOUNTER — Other Ambulatory Visit: Payer: Self-pay

## 2023-05-12 ENCOUNTER — Encounter (HOSPITAL_COMMUNITY)
Admission: RE | Disposition: A | Discharge status: Home / Self Care | Payer: Self-pay | Source: Ambulatory Visit | Attending: Otolaryngology

## 2023-05-12 DIAGNOSIS — J45909 Unspecified asthma, uncomplicated: Secondary | ICD-10-CM | POA: Diagnosis not present

## 2023-05-12 DIAGNOSIS — J343 Hypertrophy of nasal turbinates: Secondary | ICD-10-CM | POA: Insufficient documentation

## 2023-05-12 DIAGNOSIS — J324 Chronic pansinusitis: Secondary | ICD-10-CM | POA: Insufficient documentation

## 2023-05-12 HISTORY — PX: TURBINATE REDUCTION: SHX6157

## 2023-05-12 HISTORY — DX: Anxiety disorder, unspecified: F41.9

## 2023-05-12 HISTORY — DX: Attention-deficit hyperactivity disorder, unspecified type: F90.9

## 2023-05-12 LAB — CBC
HCT: 39.7 % (ref 36.0–46.0)
Hemoglobin: 12.4 g/dL (ref 12.0–15.0)
MCH: 24.8 pg — ABNORMAL LOW (ref 26.0–34.0)
MCHC: 31.2 g/dL (ref 30.0–36.0)
MCV: 79.2 fL — ABNORMAL LOW (ref 80.0–100.0)
Platelets: 333 10*3/uL (ref 150–400)
RBC: 5.01 MIL/uL (ref 3.87–5.11)
RDW: 14.2 % (ref 11.5–15.5)
WBC: 7.9 10*3/uL (ref 4.0–10.5)
nRBC: 0 % (ref 0.0–0.2)

## 2023-05-12 SURGERY — REDUCTION, NASAL TURBINATE
Anesthesia: General | Site: Nose | Laterality: Bilateral

## 2023-05-12 MED ORDER — FENTANYL CITRATE (PF) 250 MCG/5ML IJ SOLN
INTRAMUSCULAR | Status: DC | PRN
  Administered 2023-05-12 (×3): 50 ug via INTRAVENOUS
  Administered 2023-05-12: 100 ug via INTRAVENOUS

## 2023-05-12 MED ORDER — DEXAMETHASONE SODIUM PHOSPHATE 10 MG/ML IJ SOLN
INTRAMUSCULAR | Status: AC
  Filled 2023-05-12: qty 1

## 2023-05-12 MED ORDER — ONDANSETRON HCL 4 MG/2ML IJ SOLN
INTRAMUSCULAR | Status: AC
  Filled 2023-05-12: qty 2

## 2023-05-12 MED ORDER — SODIUM CHLORIDE 0.9 % IV SOLN: INTRAVENOUS | Status: DC

## 2023-05-12 MED ORDER — ORAL CARE MOUTH RINSE
15.0000 mL | Freq: Once | OROMUCOSAL | Status: AC
  Administered 2023-05-12: 15 mL via OROMUCOSAL

## 2023-05-12 MED ORDER — BACITRACIN ZINC 500 UNIT/GM EX OINT
TOPICAL_OINTMENT | CUTANEOUS | Status: DC | PRN
  Administered 2023-05-12: 1 via TOPICAL

## 2023-05-12 MED ORDER — PROPOFOL 500 MG/50ML IV EMUL
INTRAVENOUS | Status: DC | PRN
  Administered 2023-05-12: 100 ug/kg/min via INTRAVENOUS

## 2023-05-12 MED ORDER — LIDOCAINE-EPINEPHRINE 1 %-1:100000 IJ SOLN
INTRAMUSCULAR | Status: AC
  Filled 2023-05-12: qty 1

## 2023-05-12 MED ORDER — SUCCINYLCHOLINE CHLORIDE 200 MG/10ML IV SOSY
PREFILLED_SYRINGE | INTRAVENOUS | Status: AC
  Filled 2023-05-12: qty 10

## 2023-05-12 MED ORDER — 0.9 % SODIUM CHLORIDE (POUR BTL) OPTIME
TOPICAL | Status: DC | PRN
  Administered 2023-05-12: 1000 mL

## 2023-05-12 MED ORDER — FENTANYL CITRATE (PF) 250 MCG/5ML IJ SOLN
INTRAMUSCULAR | Status: AC
  Filled 2023-05-12: qty 5

## 2023-05-12 MED ORDER — EPINEPHRINE HCL (NASAL) 0.1 % NA SOLN
NASAL | Status: DC | PRN
  Administered 2023-05-12: 10 mL via NASAL

## 2023-05-12 MED ORDER — MIDAZOLAM HCL 2 MG/2ML IJ SOLN
INTRAMUSCULAR | Status: DC | PRN
  Administered 2023-05-12: 2 mg via INTRAVENOUS

## 2023-05-12 MED ORDER — MIDAZOLAM HCL 2 MG/2ML IJ SOLN
INTRAMUSCULAR | Status: AC
  Filled 2023-05-12: qty 2

## 2023-05-12 MED ORDER — DEXAMETHASONE SODIUM PHOSPHATE 10 MG/ML IJ SOLN
INTRAMUSCULAR | Status: DC | PRN
  Administered 2023-05-12: 10 mg via INTRAVENOUS

## 2023-05-12 MED ORDER — ONDANSETRON HCL 4 MG/2ML IJ SOLN
INTRAMUSCULAR | Status: DC | PRN
  Administered 2023-05-12: 4 mg via INTRAVENOUS

## 2023-05-12 MED ORDER — LIDOCAINE 2% (20 MG/ML) 5 ML SYRINGE
INTRAMUSCULAR | Status: DC | PRN
  Administered 2023-05-12: 100 mg via INTRAVENOUS

## 2023-05-12 MED ORDER — SUGAMMADEX SODIUM 200 MG/2ML IV SOLN
INTRAVENOUS | Status: DC | PRN
  Administered 2023-05-12: 400 mg via INTRAVENOUS

## 2023-05-12 MED ORDER — MUPIROCIN 2 % EX OINT
TOPICAL_OINTMENT | CUTANEOUS | Status: AC
  Filled 2023-05-12: qty 22

## 2023-05-12 MED ORDER — PROPOFOL 10 MG/ML IV BOLUS
INTRAVENOUS | Status: DC | PRN
  Administered 2023-05-12: 150 mg via INTRAVENOUS

## 2023-05-12 MED ORDER — BACITRACIN ZINC 500 UNIT/GM EX OINT
TOPICAL_OINTMENT | CUTANEOUS | Status: AC
  Filled 2023-05-12: qty 28.35

## 2023-05-12 MED ORDER — DIPHENHYDRAMINE HCL 50 MG/ML IJ SOLN
INTRAMUSCULAR | Status: DC | PRN
  Administered 2023-05-12: 12.5 mg via INTRAVENOUS

## 2023-05-12 MED ORDER — LIDOCAINE 2% (20 MG/ML) 5 ML SYRINGE
INTRAMUSCULAR | Status: AC
  Filled 2023-05-12: qty 5

## 2023-05-12 MED ORDER — FENTANYL CITRATE (PF) 100 MCG/2ML IJ SOLN: 25.0000 ug | INTRAMUSCULAR | Status: DC | PRN

## 2023-05-12 MED ORDER — ROCURONIUM BROMIDE 10 MG/ML (PF) SYRINGE
PREFILLED_SYRINGE | INTRAVENOUS | Status: AC
  Filled 2023-05-12: qty 10

## 2023-05-12 MED ORDER — ROCURONIUM BROMIDE 10 MG/ML (PF) SYRINGE
PREFILLED_SYRINGE | INTRAVENOUS | Status: DC | PRN
  Administered 2023-05-12: 20 mg via INTRAVENOUS
  Administered 2023-05-12: 50 mg via INTRAVENOUS

## 2023-05-12 MED ORDER — KETAMINE HCL 50 MG/5ML IJ SOSY
PREFILLED_SYRINGE | INTRAMUSCULAR | Status: AC
  Filled 2023-05-12: qty 5

## 2023-05-12 MED ORDER — ACETAMINOPHEN 10 MG/ML IV SOLN: 1000.0000 mg | Freq: Once | INTRAVENOUS | Status: DC | PRN

## 2023-05-12 MED ORDER — OXYMETAZOLINE HCL 0.05 % NA SOLN
NASAL | Status: AC
  Filled 2023-05-12: qty 30

## 2023-05-12 MED ORDER — EPINEPHRINE HCL (NASAL) 0.1 % NA SOLN
NASAL | Status: AC
  Filled 2023-05-12: qty 60

## 2023-05-12 MED ORDER — FLUORESCEIN SODIUM 1 MG OP STRP
ORAL_STRIP | OPHTHALMIC | Status: DC | PRN
  Administered 2023-05-12: 1

## 2023-05-12 MED ORDER — EPINEPHRINE 0.3 MG/0.3ML IJ SOAJ: 0.3000 mg | Freq: Every day | INTRAMUSCULAR | 2 refills | Status: AC | PRN

## 2023-05-12 MED ORDER — LIDOCAINE-EPINEPHRINE 1 %-1:100000 IJ SOLN
INTRAMUSCULAR | Status: DC | PRN
  Administered 2023-05-12: 1.5 mL

## 2023-05-12 MED ORDER — FLUORESCEIN SODIUM 1 MG OP STRP
ORAL_STRIP | OPHTHALMIC | Status: AC
  Filled 2023-05-12: qty 1

## 2023-05-12 MED ORDER — DEXMEDETOMIDINE HCL IN NACL 80 MCG/20ML IV SOLN
INTRAVENOUS | Status: DC | PRN
  Administered 2023-05-12: 12 ug via INTRAVENOUS

## 2023-05-12 SURGICAL SUPPLY — 34 items
ANTIFOG SOL W/FOAM PAD STRL (MISCELLANEOUS) ×1
BLADE SHAVER TURBINATE 11X2.9 (BLADE) ×2 IMPLANT
BLADE SURG 15 STRL LF DISP TIS (BLADE) IMPLANT
CANISTER SUCT 3000ML PPV (MISCELLANEOUS) ×2 IMPLANT
COAGULATOR SUCT 8FR VV (MISCELLANEOUS) IMPLANT
DRAPE HALF SHEET 40X57 (DRAPES) IMPLANT
DRSG NASOPORE 8CM (GAUZE/BANDAGES/DRESSINGS) IMPLANT
ELECT REM PT RETURN 9FT ADLT (ELECTROSURGICAL)
ELECTRODE REM PT RTRN 9FT ADLT (ELECTROSURGICAL) IMPLANT
GAUZE SPONGE 2X2 8PLY STRL LF (GAUZE/BANDAGES/DRESSINGS) ×2 IMPLANT
GAUZE SPONGE 2X2 STRL 8-PLY (GAUZE/BANDAGES/DRESSINGS) IMPLANT
GLOVE BIO SURGEON STRL SZ7.5 (GLOVE) ×2 IMPLANT
GLOVE BIOGEL PI IND STRL 8 (GLOVE) ×2 IMPLANT
GOWN STRL REUS W/ TWL LRG LVL3 (GOWN DISPOSABLE) ×2 IMPLANT
GOWN STRL REUS W/ TWL XL LVL3 (GOWN DISPOSABLE) ×2 IMPLANT
KIT BASIN OR (CUSTOM PROCEDURE TRAY) ×2 IMPLANT
KIT TURNOVER KIT B (KITS) ×2 IMPLANT
NDL HYPO 25GX1X1/2 BEV (NEEDLE) IMPLANT
NDL PRECISIONGLIDE 27X1.5 (NEEDLE) ×4 IMPLANT
NEEDLE HYPO 25GX1X1/2 BEV (NEEDLE)
NEEDLE PRECISIONGLIDE 27X1.5 (NEEDLE) ×1
NS IRRIG 1000ML POUR BTL (IV SOLUTION) ×2 IMPLANT
PAD ARMBOARD 7.5X6 YLW CONV (MISCELLANEOUS) ×2 IMPLANT
PATTIES SURGICAL .5 X3 (DISPOSABLE) ×2 IMPLANT
SHEATH ENDOSCRUB 0 DEG (SHEATH) IMPLANT
SOLUTION ANTFG W/FOAM PAD STRL (MISCELLANEOUS) ×2 IMPLANT
SPLINT NASAL DOYLE BI-VL (GAUZE/BANDAGES/DRESSINGS) IMPLANT
SUT SILK 3 0 SH 30 (SUTURE) IMPLANT
SYR CONTROL 10ML LL (SYRINGE) ×2 IMPLANT
TOWEL GREEN STERILE FF (TOWEL DISPOSABLE) ×2 IMPLANT
TRAY ENT MC OR (CUSTOM PROCEDURE TRAY) ×2 IMPLANT
TUBE SALEM SUMP 16F (TUBING) ×2 IMPLANT
TUBING CONNECTING 10 (TUBING) ×2 IMPLANT
TUBING EXTENTION W/L.L. (IV SETS) IMPLANT

## 2023-05-12 NOTE — H&P (Signed)
Julie Lozano is an 37 y.o. female.    Chief Complaint:  Inferior turbinate hypertrophy  HPI: Patient presents today for planned elective procedure.  He/she denies any interval change in history since office visit on 04/26/23.  Past Medical History:  Diagnosis Date   ADHD (attention deficit hyperactivity disorder)    Allergy    Anemia    Anxiety    Asthma    Sickle cell trait (HCC)     Past Surgical History:  Procedure Laterality Date   WISDOM TOOTH EXTRACTION      Family History  Problem Relation Age of Onset   Cancer Paternal Grandmother    Stroke Maternal Grandmother    Asthma Maternal Grandmother    Colon cancer Maternal Grandfather    High blood pressure Maternal Grandfather    Diabetes Maternal Grandfather    Heart attack Father    Stroke Father    Drug abuse Father    Obesity Brother    Obesity Sister    Diabetes Maternal Uncle    Hypertension Other    Hyperlipidemia Other     Social History:  reports that she has never smoked. She has never used smokeless tobacco. She reports that she does not currently use alcohol after a past usage of about 2.0 standard drinks of alcohol per week. She reports that she does not use drugs.  Allergies:  Allergies  Allergen Reactions   Latex Anaphylaxis and Hives   Mold Extract [Trichophyton] Shortness Of Breath   Other Shortness Of Breath    mildew   Shellfish Allergy Anaphylaxis   American Cockroach Other (See Comments)    Asthma attack sneezing   Bee Venom Swelling   Dust Mite Extract Other (See Comments)    Seasonal allergies    Grass Pollen(K-O-R-T-Swt Vern) Hives, Swelling and Other (See Comments)    Contact dermatitis   Red Dye #40 (Allura Red) Diarrhea, Itching and Nausea Only   Tree Extract Other (See Comments)    unknown    Medications Prior to Admission  Medication Sig Dispense Refill   loratadine (CLARITIN) 10 MG tablet Take 10 mg by mouth daily.     Tetrahydrozoline-Zn Sulfate (EYE DROPS RELIEF  OP) Place 1 drop into both eyes daily as needed (Itching eyes). bausch and lomb eye drops     acetaminophen (TYLENOL) 325 MG tablet Take 2 tablets (650 mg total) by mouth every 6 (six) hours as needed for mild pain (or Fever >/= 101).     albuterol (PROVENTIL HFA;VENTOLIN HFA) 108 (90 Base) MCG/ACT inhaler Inhale 2 puffs into the lungs every 4 (four) hours as needed for wheezing. 6.7 g 0   budesonide (PULMICORT) 0.5 MG/2ML nebulizer solution Take 0.5 mg by nebulization daily as needed (Nasal passages).     cyclobenzaprine (FLEXERIL) 10 MG tablet Take 1 tablet (10 mg total) by mouth 3 (three) times daily as needed for muscle spasms. (Patient not taking: Reported on 05/06/2023) 20 tablet 0   EPINEPHrine 0.3 mg/0.3 mL IJ SOAJ injection Inject 0.3 mg into the muscle daily as needed for anaphylaxis.     ibuprofen (ADVIL) 800 MG tablet Take 1 tablet (800 mg total) by mouth 3 (three) times daily. (Patient not taking: Reported on 05/06/2023) 21 tablet 0   lidocaine (LIDODERM) 5 % Place 1 patch onto the skin daily. Remove & Discard patch within 12 hours or as directed by MD (Patient not taking: Reported on 05/06/2023) 30 patch 0    Results for orders placed or performed during  the hospital encounter of 05/12/23 (from the past 48 hours)  CBC per protocol     Status: Abnormal   Collection Time: 05/12/23  1:14 PM  Result Value Ref Range   WBC 7.9 4.0 - 10.5 K/uL   RBC 5.01 3.87 - 5.11 MIL/uL   Hemoglobin 12.4 12.0 - 15.0 g/dL   HCT 47.4 25.9 - 56.3 %   MCV 79.2 (L) 80.0 - 100.0 fL   MCH 24.8 (L) 26.0 - 34.0 pg   MCHC 31.2 30.0 - 36.0 g/dL   RDW 87.5 64.3 - 32.9 %   Platelets 333 150 - 400 K/uL   nRBC 0.0 0.0 - 0.2 %    Comment: Performed at [facility], [address], [place], [place] [address]   No results found.  ROS: negative other than stated in HPI  Blood pressure 96/83, pulse 70, temperature 98.1 F (36.7 C), resp. rate 16, height 5\' 3"  (1.6 m), weight 74.4 kg, last menstrual period  04/15/2023, SpO2 100%.  PHYSICAL EXAM: General: Resting comfortably in NAD  Lungs: Non-labored respiratinos  Studies Reviewed: none   Assessment/Plan Inferior turbinate hypertrophy.  Proceed with bilateral inferior turbinate reduction under GA. Informed consent obtained, RBA discussed.     Electronically signed by:  Scarlette Ar, MD  Staff Physician Facial Plastic & Reconstructive Surgery Otolaryngology - Head and Neck Surgery [facility]  05/12/2023, 3:34 PM

## 2023-05-12 NOTE — Anesthesia Preprocedure Evaluation (Addendum)
Anesthesia Evaluation  Patient identified by MRN, date of birth, ID band Patient awake    Reviewed: Allergy & Precautions, NPO status , Patient's Chart, lab work & pertinent test results  History of Anesthesia Complications Negative for: history of anesthetic complications  Airway Mallampati: II  TM Distance: >3 FB Neck ROM: Full    Dental  (+) Dental Advisory Given, Teeth Intact   Pulmonary neg shortness of breath, asthma (no recent inhaler use) , neg sleep apnea, neg COPD, neg recent URI   Pulmonary exam normal breath sounds clear to auscultation       Cardiovascular negative cardio ROS  Rhythm:Regular Rate:Normal     Neuro/Psych  PSYCHIATRIC DISORDERS Anxiety     negative neurological ROS     GI/Hepatic negative GI ROS, Neg liver ROS,,,  Endo/Other  negative endocrine ROS    Renal/GU negative Renal ROS  negative genitourinary   Musculoskeletal negative musculoskeletal ROS (+)    Abdominal   Peds  Hematology  (+) Blood dyscrasia, anemia Lab Results      Component                Value               Date                      WBC                      7.9                 05/12/2023                HGB                      12.4                05/12/2023                HCT                      39.7                05/12/2023                MCV                      79.2 (L)            05/12/2023                PLT                      333                 05/12/2023              Anesthesia Other Findings Turbinate hypertrophy   Reproductive/Obstetrics                             Anesthesia Physical Anesthesia Plan  ASA: 2  Anesthesia Plan: General   Post-op Pain Management:    Induction: Intravenous  PONV Risk Score and Plan: 3 and Ondansetron, Dexamethasone, Midazolam and Treatment may vary due to age or medical condition  Airway Management Planned: Mask and Oral ETT  Additional  Equipment: None  Intra-op Plan:   Post-operative Plan: Extubation  in OR  Informed Consent: I have reviewed the patients History and Physical, chart, labs and discussed the procedure including the risks, benefits and alternatives for the proposed anesthesia with the patient or authorized representative who has indicated his/her understanding and acceptance.     Dental advisory given  Plan Discussed with: CRNA and Anesthesiologist  Anesthesia Plan Comments: (Risks of general anesthesia discussed including, but not limited to, sore throat, hoarse voice, chipped/damaged teeth, injury to vocal cords, nausea and vomiting, allergic reactions, lung infection, heart attack, stroke, and death. All questions answered. )       Anesthesia Quick Evaluation

## 2023-05-12 NOTE — Progress Notes (Signed)
Urine pregnancy test Negative. Unable to upload results into the system.

## 2023-05-12 NOTE — Op Note (Signed)
OPERATIVE NOTE  Julie Lozano Date/Time of Admission: 05/12/2023  1:04 PM  CSN: [id number];[id number] Attending Provider: Scarlette Ar, MD Room/Bed: MCPO/NONE DOB: 10-11-1985 Age: 37 y.o.   Pre-Op Diagnosis: Hypertrophy nasal turbinate; Chronic pansinusitis; Perennial allergic rhinitis  Post-Op Diagnosis: Hypertrophy nasal turbinate; Chronic pansinusitis; Perennial allergic rhinitis  Procedure: Procedure(s): BILATERAL INFERIOR TURBINATE REDUCTION - SUBMUCOUS  Anesthesia: General  Surgeon(s): Mervin Kung, MD  Staff: Circulator: Tawny Hopping, RN Scrub Person: Delorse Limber, CST  Implants: * No implants in log *  Specimens: * No specimens in log *  Complications: none  EBL: 25 ML  IVF: Per anesthesia ML  Condition: stable  Operative Findings:  4+ boggy inferior turbinate hypertrophy Nasal septum straight Middle turbinates/middle meatus normal. Nasopharynx clear  Description of Operation: The patient was identified in the pre-op area and consent confirmed in the chart. The patient was then brought to the operative suite, placed in a supine position, and general anesthesia induced. We then performed an operative time out to confirm proper patient and surgery to be performed. After all were in agreement, we began with surgery.   1:1000 epinephrine-soaked pledgets were placed in both nasal cavities. The patient was then prepped and draped in a standard fashion for septoplasty.  We proceeded with the bilateral inferior turbinate reduction. Starting on the left side, a 15 blade scalpel was used to make an incision in the axilla of the head of the inferior turbinate. A cottle was used to raise a medial submucoperiosteal plane. The 2.44mm turbinate microdebrider was used to perform submucous resection. Inferior turbinate bone was removed with blakesly forceps. The turbinate was outfractured with the boise elevator and hemostasis achieved with bovie and epi  pledgets. The right side was treated identically.   Doyle splints were placed bilaterally followed nasopore splints along the heads of the inferior turbinates and a drip pad. The patient was extubated in the operating room and taken to the recovery area in stable condition. The patient tolerated the procedure without difficulty.   Mervin Kung, MD [facility]  05/12/2023

## 2023-05-12 NOTE — Anesthesia Procedure Notes (Signed)
Procedure Name: Intubation Date/Time: 05/12/2023 4:08 PM  Performed by: Jimmey Ralph, CRNAPre-anesthesia Checklist: Patient identified, Emergency Drugs available, Suction available and Patient being monitored Patient Re-evaluated:Patient Re-evaluated prior to induction Oxygen Delivery Method: Circle system utilized Preoxygenation: Pre-oxygenation with 100% oxygen Induction Type: IV induction Ventilation: Mask ventilation without difficulty Laryngoscope Size: Mac and 3 Grade View: Grade III Tube type: Oral Tube size: 7.0 mm Number of attempts: 1 Airway Equipment and Method: Stylet and Oral airway Placement Confirmation: ETT inserted through vocal cords under direct vision, positive ETCO2 and breath sounds checked- equal and bilateral Secured at: 22 cm Tube secured with: Tape Dental Injury: Teeth and Oropharynx as per pre-operative assessment and Injury to lip  Difficulty Due To: Difficulty was unanticipated, Difficult Airway- due to immobile epiglottis and Difficult Airway- due to limited oral opening Future Recommendations: Recommend- induction with short-acting agent, and alternative techniques readily available Comments: Pt has a small opening; stiff, even p using gas and waiting longer for zemuron to take effect. Repositioned head; 2mm lac on bottom left lip, otherwise all oral as in preop .

## 2023-05-12 NOTE — Transfer of Care (Signed)
Immediate Anesthesia Transfer of Care Note  Patient: Julie Lozano  Procedure(s) Performed: INFERIOR TURBINATE REDUCTION (Bilateral: Nose)  Patient Location: PACU  Anesthesia Type:General  Level of Consciousness: awake, alert , oriented, and drowsy  Airway & Oxygen Therapy: Patient Spontanous Breathing and Patient connected to face mask oxygen  Post-op Assessment: Report given to RN and Post -op Vital signs reviewed and stable  Post vital signs: Reviewed and stable  Last Vitals:  Vitals Value Taken Time  BP 118/53 05/12/23 1731  Temp 36.5 C 05/12/23 1730  Pulse 94 05/12/23 1735  Resp 10 05/12/23 1736  SpO2 99 % 05/12/23 1735  Vitals shown include unfiled device data.  Last Pain:  Vitals:   05/12/23 1339  PainSc: 0-No pain         Complications:  Encounter Notable Events  Notable Event Outcome Phase Comment  Difficult to intubate - unexpected  Intraprocedure Filed from anesthesia note documentation.

## 2023-05-12 NOTE — Discharge Instructions (Addendum)
DISCHARGE INSTRUCTIONS FOLLOWING YOUR inferior turbinate reductions  1.  Bloody nasal discharge for several days is common in the post operative period. Please use over the counter saline sprays every 3 hours while you are awake to lubricate your nasal tissues. Please use over the counter antibiotic ointment twice daily until your first post-operative visit.  2.  Use nasal slings to absorb nasal drainage 3.  Swelling and bruising should be minimal 4.  Your nose will be more congested because of blood and mucus until the splints are removed 5.  Take it easy, as reduced energy for several days following surgery is normal 6.  You should be able to return to work in 5 to 7 days 7.  You will have an appointment in 5-7 days following surgery for removal of your splints 8.  Your nose is "fragile" for 6 weeks, avoid trauma to the nose during this period.  MEDICATIONS 1. Resume all home medications as directed. 2. Start new discharge medications as directed. 3. Do not drive or operate machinery while taking narcotic pain medications.   DIET 1. Resume same diet prior to your hospital admission.   RETURN 1. Follow-up in the ENT clinic as scheduled 2. Call [phone] to confirm or schedule your appointment.  3. Call [phone] or go to the Emergency Department near you for any symptoms such as fevers with temperature greater than 101.5 F, chills, persistent nausea and vomiting, severe pain not controlled with medications, purulent or foul-smelling drainage from the wound site, or for any acute problems or illlness  Scarlette Ar MD [facility] - [place] [address] [place], [place] [address] Phone: [phone]

## 2023-05-13 ENCOUNTER — Encounter (HOSPITAL_COMMUNITY): Payer: Self-pay | Admitting: Otolaryngology

## 2023-05-13 NOTE — Anesthesia Postprocedure Evaluation (Signed)
Anesthesia Post Note  Patient: Julie Lozano  Procedure(s) Performed: INFERIOR TURBINATE REDUCTION (Bilateral: Nose)     Patient location during evaluation: PACU Anesthesia Type: General Level of consciousness: awake and alert Pain management: pain level controlled Vital Signs Assessment: post-procedure vital signs reviewed and stable Respiratory status: spontaneous breathing, nonlabored ventilation, respiratory function stable and patient connected to nasal cannula oxygen Cardiovascular status: blood pressure returned to baseline and stable Postop Assessment: no apparent nausea or vomiting Anesthetic complications: yes   Encounter Notable Events  Notable Event Outcome Phase Comment  Difficult to intubate - unexpected  Intraprocedure Filed from anesthesia note documentation.    Last Vitals:  Vitals:   05/12/23 1815 05/12/23 1830  BP: 117/83 116/84  Pulse: 91 74  Resp: (!) 24 15  Temp:  36.5 C  SpO2: 96% 99%    Last Pain:  Vitals:   05/12/23 1339  PainSc: 0-No pain                 Earl Lites P Havanah Nelms

## 2024-05-11 ENCOUNTER — Encounter: Payer: Self-pay | Admitting: Emergency Medicine

## 2024-05-11 ENCOUNTER — Ambulatory Visit
Admission: EM | Admit: 2024-05-11 | Discharge: 2024-05-11 | Disposition: A | Discharge status: Home / Self Care | Payer: Self-pay

## 2024-05-11 DIAGNOSIS — L2389 Allergic contact dermatitis due to other agents: Secondary | ICD-10-CM | POA: Insufficient documentation

## 2024-05-11 DIAGNOSIS — J101 Influenza due to other identified influenza virus with other respiratory manifestations: Secondary | ICD-10-CM

## 2024-05-11 LAB — POC COVID19/FLU A&B COMBO
Covid Antigen, POC: NEGATIVE
Influenza A Antigen, POC: POSITIVE — AB
Influenza B Antigen, POC: NEGATIVE

## 2024-05-11 MED ORDER — OSELTAMIVIR PHOSPHATE 75 MG PO CAPS: 75.0000 mg | ORAL_CAPSULE | Freq: Two times a day (BID) | ORAL | 0 refills | Status: AC

## 2024-05-11 NOTE — ED Provider Notes (Signed)
 [facility]    CSN: [id number] Arrival date & time: 05/11/24  9182      History   Chief Complaint Chief Complaint  Patient presents with   Fever   Sore Throat   Headache   Fatigue   Nasal Congestion    HPI Julie Lozano is a 38 y.o. female.   Pt presents today due to feeling poorly since Tuesday. Pt states that she has been experiencing headache, fatigue, nasal congestion, nasal drainage, and throat pain. Pt states that her highest temp was 100.0. Pt states that she is eating less but still drinking plenty. Pt states that she recently returned from an art festival. Pt states that she has been taking Vitamin C for her symptoms with no significant relief.   The history is provided by the patient.  Fever Associated symptoms: headaches   Sore Throat Associated symptoms include headaches.  Headache Associated symptoms: fever     Past Medical History:  Diagnosis Date   ADHD (attention deficit hyperactivity disorder)    Allergy    Anemia    Anxiety    Asthma    Sickle cell trait     Patient Active Problem List   Diagnosis Date Noted   Allergic contact dermatitis due to other agents 05/11/2024   Fever 05/09/2019   Chronic pansinusitis 12/14/2016   Hypertrophy, nasal, turbinate 12/14/2016   Pain in left foot 12/21/2014   Neck pain 08/30/2012   Eczema 08/30/2012   ANEMIA-NOS 09/08/2008   Perennial allergic rhinitis 09/08/2008   Asthma 08/29/2008    Past Surgical History:  Procedure Laterality Date   TURBINATE REDUCTION Bilateral 05/12/2023   Procedure: INFERIOR TURBINATE REDUCTION;  Surgeon: Luciano Standing, MD;  Location: [facility] OR;  Service: ENT;  Laterality: Bilateral;   WISDOM TOOTH EXTRACTION      OB History     Gravida  0   Para  0   Term  0   Preterm  0   AB  0   Living  0      SAB  0   IAB  0   Ectopic  0   Multiple  0   Live Births  0            Home Medications    Prior to Admission medications   Medication Sig Start Date End Date Taking? Authorizing Provider  hydrocortisone 2.5 % cream 1 Application.   Yes [provider]  ibuprofen  (ADVIL ) 800 MG tablet Take 1 tablet (800 mg total) by mouth 3 (three) times daily. 12/25/22  Yes Rising, Asberry, PA-C  loratadine (CLARITIN) 10 MG tablet Take 10 mg by mouth daily.   Yes [provider]  oseltamivir (TAMIFLU) 75 MG capsule Take 1 capsule (75 mg total) by mouth every 12 (twelve) hours. 05/11/24  Yes Andra Corean BROCKS, PA-C  acetaminophen  (TYLENOL ) 325 MG tablet Take 2 tablets (650 mg total) by mouth every 6 (six) hours as needed for mild pain (or Fever >/= 101). Patient not taking: Reported on 05/11/2024 05/09/19   Fairy Frames, MD  albuterol  (PROVENTIL  HFA;VENTOLIN  HFA) 108 (90 Base) MCG/ACT inhaler Inhale 2 puffs into the lungs every 4 (four) hours as needed for wheezing. Patient not taking: Reported on 05/11/2024 08/13/16   Nafziger, Cory, NP  budesonide (PULMICORT) 0.5 MG/2ML nebulizer solution Take 0.5 mg by nebulization daily as needed (Nasal passages). Patient not taking: Reported on 05/11/2024 03/01/23   [provider]  clobetasol ointment (TEMOVATE) 0.05 % 1 Application. Patient not  taking: Reported on 05/11/2024    [provider]  cyclobenzaprine  (FLEXERIL ) 10 MG tablet Take 1 tablet (10 mg total) by mouth 3 (three) times daily as needed for muscle spasms. Patient not taking: Reported on 05/06/2023 12/25/22   Rising, Asberry, PA-C  fluconazole (DIFLUCAN) 150 MG tablet  12/10/22   [provider]  hydrOXYzine  (ATARAX ) 10 MG tablet 1 tablet as needed Orally Once a day, at bedtime; Duration: 14 days Patient not taking: Reported on 05/11/2024    [provider]  lidocaine  (LIDODERM ) 5 % Place 1 patch onto the skin daily. Remove & Discard patch within 12 hours or as directed by MD Patient not taking: Reported on 05/06/2023 12/05/22   Vicky Charleston, PA-C  predniSONE  (DELTASONE ) 10  MG tablet taper Orally 4 tabs daily for 4 days then 3 tabs daily for 4 days then 2 tabs daily for 4 days then 1 tab daily for 4 days Patient not taking: Reported on 05/11/2024 02/15/22   [provider]  Tetrahydrozoline-Zn Sulfate (EYE DROPS RELIEF OP) Place 1 drop into both eyes daily as needed (Itching eyes). bausch and lomb eye drops Patient not taking: Reported on 05/11/2024    [provider]    Family History Family History  Problem Relation Age of Onset   Cancer Paternal Grandmother    Stroke Maternal Grandmother    Asthma Maternal Grandmother    Colon cancer Maternal Grandfather    High blood pressure Maternal Grandfather    Diabetes Maternal Grandfather    Heart attack Father    Stroke Father    Drug abuse Father    Obesity Brother    Obesity Sister    Diabetes Maternal Uncle    Hypertension Other    Hyperlipidemia Other     Social History Social History[1]   Allergies   Latex, Mold extract [trichophyton], Other, Shellfish allergy, American cockroach, Bee venom, Dust mite extract, Grass pollen([id number]-o-[id number]-swt vern), Molds & smuts, Red dye #40 (allura red), and Tree extract   Review of Systems Review of Systems  Constitutional:  Positive for fever.  Neurological:  Positive for headaches.     Physical Exam Triage Vital Signs ED Triage Vitals  Encounter Vitals Group     BP 05/11/24 0836 100/69     Girls Systolic BP Percentile --      Girls Diastolic BP Percentile --      Boys Systolic BP Percentile --      Boys Diastolic BP Percentile --      Pulse Rate 05/11/24 0836 87     Resp 05/11/24 0836 14     Temp 05/11/24 0836 98.6 F (37 C)     Temp Source 05/11/24 0836 Oral     SpO2 05/11/24 0836 96 %     Weight --      Height --      Head Circumference --      Peak Flow --      Pain Score 05/11/24 0833 4     Pain Loc --      Pain Education --      Exclude from Growth Chart --    No data found.  Updated Vital Signs BP 100/69 (BP  Location: Left Arm)   Pulse 87   Temp 98.6 F (37 C) (Oral)   Resp 14   LMP 04/27/2024 (Approximate)   SpO2 96%   Visual Acuity Right Eye Distance:   Left Eye Distance:   Bilateral Distance:    Right Eye  Near:   Left Eye Near:    Bilateral Near:     Physical Exam Vitals and nursing note reviewed.  Constitutional:      General: She is not in acute distress.    Appearance: Normal appearance. She is not ill-appearing, toxic-appearing or diaphoretic.  HENT:     Nose: Congestion (moderately enlarged turbinates) present. No rhinorrhea.     Mouth/Throat:     Mouth: Mucous membranes are moist.     Pharynx: Oropharynx is clear. Posterior oropharyngeal erythema present. No oropharyngeal exudate.  Eyes:     General: No scleral icterus. Cardiovascular:     Rate and Rhythm: Normal rate and regular rhythm.     Heart sounds: Normal heart sounds.  Pulmonary:     Effort: Pulmonary effort is normal. No respiratory distress.     Breath sounds: Normal breath sounds. No wheezing or rhonchi.  Skin:    General: Skin is warm.  Neurological:     Mental Status: She is alert and oriented to person, place, and time.  Psychiatric:        Mood and Affect: Mood normal.        Behavior: Behavior normal.      UC Treatments / Results  Labs (all labs ordered are listed, but only abnormal results are displayed) Labs Reviewed  POC COVID19/FLU A&B COMBO - Abnormal; Notable for the following components:      Result Value   Influenza A Antigen, POC Positive (*)    All other components within normal limits    EKG   Radiology No results found.  Procedures Procedures (including critical care time)  Medications Ordered in UC Medications - No data to display  Initial Impression / Assessment and Plan / UC Course  I have reviewed the triage vital signs and the nursing notes.  Pertinent labs & imaging results that were available during my care of the patient were reviewed by me and considered  in my medical decision making (see chart for details).     Final Clinical Impressions(s) / UC Diagnoses   Final diagnoses:  Influenza A     Discharge Instructions      You been diagnosed with a viral illness today. -Viruses have to run their course and medicines that are prescribed are meant to help with symptoms. - With viruses usually feel poorly from 3 to 7 days with cough being the last symptoms to resolve.  -Cough can linger from days to weeks.  Antibiotics are not effective for viruses. -If your cough lasts more than 2 weeks and you are coughing so hard that you are vomiting or feel like you could pass out we need to follow-up with PCP for further testing and evaluation. -Rest, increase water intake, may use pseudoephedrine for nasal congestion, Delsym (dextromethorphan) or honey as needed for cough, and ibuprofen  and/or Tylenol  as directed on packaging for pain and fever. -If you have hypertension you should take Coricidin or other OTC meds approved for people with high blood pressure. -You may use a spoonful of honey every 4-6 hours as needed for throat pain and cough. -Warm tea with honey and lemon are helpful for soothe throat as well.  Chloraseptic and Cepacol make a throat lozenge with numbing medication, can be purchased over-the-counter. -May also use Flonase  or sinus rinse for sinus pressure or nasal congestion.  Be sure to use distilled bottled water for sinus rinses. -May use coolmist humidifier to open up nasal passages -May elevate head to assist with postnasal  drainage. -If you feel poorly (fever, fatigue, shortness of breath, nausea, etc.) for more than 10 days to be sure to follow-up with PCP or in clinic for further evaluation and additional treatments. If you experience chest pain with shortness of breath or pulse oxygen less than 95% you should report to the ER.      ED Prescriptions     Medication Sig Dispense Auth. Provider   oseltamivir (TAMIFLU) 75 MG  capsule Take 1 capsule (75 mg total) by mouth every 12 (twelve) hours. 10 capsule Andra Corean BROCKS, PA-C      PDMP not reviewed this encounter.     [1]  Social History Tobacco Use   Smoking status: Never    Passive exposure: Never   Smokeless tobacco: Never  Vaping Use   Vaping status: Never Used  Substance Use Topics   Alcohol use: Yes    Alcohol/week: 2.0 standard drinks of alcohol    Types: 2 Cans of beer per week    Comment: social   Drug use: No     Andra Corean BROCKS, PA-C 05/11/24 [id number]

## 2024-05-11 NOTE — Discharge Instructions (Signed)

## 2024-05-11 NOTE — ED Triage Notes (Signed)
 Pt reports sore throat, headaches, fatigue, fevers, and nasal congestion x2 days. Max temp: 100.0, yesterday. Denies body aches, diarrhea, and N/V. Has only taken vitamin C, magnesium , and zinc  throat lozenges with no relief. Pt asks for covid/flu testing.

## 2024-07-10 ENCOUNTER — Ambulatory Visit: Payer: Self-pay | Admitting: Primary Care
